# Patient Record
Sex: Female | Born: 1980 | Race: White | Hispanic: No | Marital: Single | Smoking: Never smoker
Health system: Southern US, Community
[De-identification: ages and names within clinical notes are randomized; demographics above are authoritative.]

## PROBLEM LIST (undated history)

## (undated) DIAGNOSIS — R7303 Prediabetes: Secondary | ICD-10-CM

## (undated) DIAGNOSIS — N809 Endometriosis, unspecified: Secondary | ICD-10-CM

## (undated) DIAGNOSIS — R112 Nausea with vomiting, unspecified: Secondary | ICD-10-CM

## (undated) DIAGNOSIS — J45909 Unspecified asthma, uncomplicated: Secondary | ICD-10-CM

## (undated) DIAGNOSIS — U071 COVID-19: Secondary | ICD-10-CM

## (undated) DIAGNOSIS — J189 Pneumonia, unspecified organism: Secondary | ICD-10-CM

## (undated) DIAGNOSIS — Z9889 Other specified postprocedural states: Secondary | ICD-10-CM

## (undated) DIAGNOSIS — R519 Headache, unspecified: Secondary | ICD-10-CM

## (undated) DIAGNOSIS — R51 Headache: Secondary | ICD-10-CM

## (undated) DIAGNOSIS — N200 Calculus of kidney: Secondary | ICD-10-CM

## (undated) DIAGNOSIS — I10 Essential (primary) hypertension: Secondary | ICD-10-CM

## (undated) HISTORY — PX: LAPAROSCOPY: SHX197

## (undated) HISTORY — PX: CHOLECYSTECTOMY: SHX55

## (undated) HISTORY — DX: COVID-19: U07.1

## (undated) HISTORY — DX: Calculus of kidney: N20.0

## (undated) HISTORY — PX: OTHER SURGICAL HISTORY: SHX169

## (undated) HISTORY — DX: Unspecified asthma, uncomplicated: J45.909

## (undated) HISTORY — PX: KIDNEY STONE SURGERY: SHX686

---

## 1999-09-30 ENCOUNTER — Emergency Department (HOSPITAL_COMMUNITY): Admission: EM | Admit: 1999-09-30 | Discharge: 1999-10-01 | Payer: Self-pay | Admitting: Internal Medicine

## 1999-10-12 ENCOUNTER — Emergency Department (HOSPITAL_COMMUNITY): Admission: EM | Admit: 1999-10-12 | Discharge: 1999-10-12 | Payer: Self-pay

## 1999-10-13 ENCOUNTER — Encounter: Payer: Self-pay | Admitting: Urology

## 1999-10-13 ENCOUNTER — Ambulatory Visit (HOSPITAL_COMMUNITY): Admission: RE | Admit: 1999-10-13 | Discharge: 1999-10-13 | Payer: Self-pay | Admitting: Urology

## 1999-10-15 ENCOUNTER — Emergency Department (HOSPITAL_COMMUNITY): Admission: EM | Admit: 1999-10-15 | Discharge: 1999-10-15 | Payer: Self-pay | Admitting: Emergency Medicine

## 1999-10-15 ENCOUNTER — Encounter: Payer: Self-pay | Admitting: Emergency Medicine

## 1999-10-16 ENCOUNTER — Emergency Department (HOSPITAL_COMMUNITY): Admission: EM | Admit: 1999-10-16 | Discharge: 1999-10-16 | Payer: Self-pay | Admitting: Emergency Medicine

## 1999-10-16 ENCOUNTER — Encounter: Payer: Self-pay | Admitting: Emergency Medicine

## 1999-10-18 ENCOUNTER — Encounter: Payer: Self-pay | Admitting: Emergency Medicine

## 1999-10-18 ENCOUNTER — Inpatient Hospital Stay (HOSPITAL_COMMUNITY): Admission: EM | Admit: 1999-10-18 | Discharge: 1999-10-21 | Payer: Self-pay | Admitting: Emergency Medicine

## 2000-01-11 ENCOUNTER — Ambulatory Visit (HOSPITAL_COMMUNITY): Admission: RE | Admit: 2000-01-11 | Discharge: 2000-01-11 | Payer: Self-pay | Admitting: Obstetrics and Gynecology

## 2000-01-11 ENCOUNTER — Encounter (INDEPENDENT_AMBULATORY_CARE_PROVIDER_SITE_OTHER): Payer: Self-pay

## 2001-01-29 ENCOUNTER — Emergency Department (HOSPITAL_COMMUNITY): Admission: EM | Admit: 2001-01-29 | Discharge: 2001-01-29 | Payer: Self-pay | Admitting: Emergency Medicine

## 2001-08-02 ENCOUNTER — Ambulatory Visit (HOSPITAL_COMMUNITY): Admission: RE | Admit: 2001-08-02 | Discharge: 2001-08-02 | Payer: Self-pay | Admitting: Obstetrics and Gynecology

## 2001-08-02 ENCOUNTER — Encounter: Payer: Self-pay | Admitting: Obstetrics and Gynecology

## 2001-09-10 ENCOUNTER — Other Ambulatory Visit: Admission: RE | Admit: 2001-09-10 | Discharge: 2001-09-10 | Payer: Self-pay | Admitting: Obstetrics and Gynecology

## 2002-01-03 ENCOUNTER — Inpatient Hospital Stay (HOSPITAL_COMMUNITY): Admission: AD | Admit: 2002-01-03 | Discharge: 2002-01-03 | Payer: Self-pay | Admitting: Obstetrics and Gynecology

## 2002-01-19 ENCOUNTER — Encounter: Payer: Self-pay | Admitting: Obstetrics and Gynecology

## 2002-01-19 ENCOUNTER — Inpatient Hospital Stay (HOSPITAL_COMMUNITY): Admission: AD | Admit: 2002-01-19 | Discharge: 2002-01-19 | Payer: Self-pay | Admitting: Obstetrics and Gynecology

## 2002-01-21 ENCOUNTER — Inpatient Hospital Stay (HOSPITAL_COMMUNITY): Admission: AD | Admit: 2002-01-21 | Discharge: 2002-01-21 | Payer: Self-pay | Admitting: Obstetrics and Gynecology

## 2002-01-21 ENCOUNTER — Encounter: Payer: Self-pay | Admitting: Obstetrics and Gynecology

## 2002-01-22 ENCOUNTER — Inpatient Hospital Stay (HOSPITAL_COMMUNITY): Admission: AD | Admit: 2002-01-22 | Discharge: 2002-01-22 | Payer: Self-pay | Admitting: Obstetrics and Gynecology

## 2002-01-25 ENCOUNTER — Inpatient Hospital Stay (HOSPITAL_COMMUNITY): Admission: AD | Admit: 2002-01-25 | Discharge: 2002-01-25 | Payer: Self-pay | Admitting: Obstetrics and Gynecology

## 2002-01-26 ENCOUNTER — Inpatient Hospital Stay (HOSPITAL_COMMUNITY): Admission: AD | Admit: 2002-01-26 | Discharge: 2002-01-26 | Payer: Self-pay | Admitting: *Deleted

## 2002-01-28 DIAGNOSIS — I10 Essential (primary) hypertension: Secondary | ICD-10-CM

## 2002-01-28 HISTORY — DX: Essential (primary) hypertension: I10

## 2002-01-31 ENCOUNTER — Encounter (INDEPENDENT_AMBULATORY_CARE_PROVIDER_SITE_OTHER): Payer: Self-pay | Admitting: Specialist

## 2002-01-31 ENCOUNTER — Inpatient Hospital Stay (HOSPITAL_COMMUNITY): Admission: AD | Admit: 2002-01-31 | Discharge: 2002-02-07 | Payer: Self-pay | Admitting: Obstetrics and Gynecology

## 2002-01-31 ENCOUNTER — Encounter: Payer: Self-pay | Admitting: Obstetrics and Gynecology

## 2002-02-01 ENCOUNTER — Encounter: Payer: Self-pay | Admitting: Obstetrics and Gynecology

## 2002-02-03 ENCOUNTER — Encounter: Payer: Self-pay | Admitting: Obstetrics and Gynecology

## 2002-02-05 ENCOUNTER — Encounter: Payer: Self-pay | Admitting: *Deleted

## 2002-02-07 ENCOUNTER — Encounter (HOSPITAL_COMMUNITY): Admission: RE | Admit: 2002-02-07 | Discharge: 2002-03-09 | Payer: Self-pay | Admitting: Obstetrics and Gynecology

## 2002-03-20 ENCOUNTER — Other Ambulatory Visit: Admission: RE | Admit: 2002-03-20 | Discharge: 2002-03-20 | Payer: Self-pay | Admitting: Obstetrics and Gynecology

## 2003-08-18 ENCOUNTER — Other Ambulatory Visit: Admission: RE | Admit: 2003-08-18 | Discharge: 2003-08-18 | Payer: Self-pay | Admitting: Obstetrics and Gynecology

## 2004-08-20 ENCOUNTER — Emergency Department (HOSPITAL_COMMUNITY): Admission: EM | Admit: 2004-08-20 | Discharge: 2004-08-20 | Payer: Self-pay | Admitting: Emergency Medicine

## 2004-09-14 ENCOUNTER — Other Ambulatory Visit: Admission: RE | Admit: 2004-09-14 | Discharge: 2004-09-14 | Payer: Self-pay | Admitting: Obstetrics and Gynecology

## 2004-09-16 ENCOUNTER — Emergency Department (HOSPITAL_COMMUNITY): Admission: EM | Admit: 2004-09-16 | Discharge: 2004-09-17 | Payer: Self-pay | Admitting: Emergency Medicine

## 2004-11-24 ENCOUNTER — Inpatient Hospital Stay (HOSPITAL_COMMUNITY): Admission: AD | Admit: 2004-11-24 | Discharge: 2004-11-24 | Payer: Self-pay | Admitting: Obstetrics and Gynecology

## 2005-01-08 ENCOUNTER — Inpatient Hospital Stay (HOSPITAL_COMMUNITY): Admission: AD | Admit: 2005-01-08 | Discharge: 2005-01-08 | Payer: Self-pay | Admitting: Obstetrics and Gynecology

## 2005-01-10 ENCOUNTER — Inpatient Hospital Stay (HOSPITAL_COMMUNITY): Admission: AD | Admit: 2005-01-10 | Discharge: 2005-01-10 | Payer: Self-pay | Admitting: Obstetrics and Gynecology

## 2005-01-27 ENCOUNTER — Inpatient Hospital Stay (HOSPITAL_COMMUNITY): Admission: AD | Admit: 2005-01-27 | Discharge: 2005-01-28 | Payer: Self-pay | Admitting: Obstetrics and Gynecology

## 2005-03-10 ENCOUNTER — Inpatient Hospital Stay (HOSPITAL_COMMUNITY): Admission: AD | Admit: 2005-03-10 | Discharge: 2005-03-11 | Payer: Self-pay | Admitting: Obstetrics and Gynecology

## 2005-03-11 ENCOUNTER — Encounter (INDEPENDENT_AMBULATORY_CARE_PROVIDER_SITE_OTHER): Payer: Self-pay | Admitting: Specialist

## 2005-03-11 ENCOUNTER — Inpatient Hospital Stay (HOSPITAL_COMMUNITY): Admission: RE | Admit: 2005-03-11 | Discharge: 2005-03-14 | Payer: Self-pay | Admitting: Obstetrics and Gynecology

## 2005-05-04 ENCOUNTER — Other Ambulatory Visit: Admission: RE | Admit: 2005-05-04 | Discharge: 2005-05-04 | Payer: Self-pay | Admitting: Obstetrics and Gynecology

## 2006-04-24 ENCOUNTER — Emergency Department (HOSPITAL_COMMUNITY): Admission: EM | Admit: 2006-04-24 | Discharge: 2006-04-24 | Payer: Self-pay | Admitting: Emergency Medicine

## 2006-07-03 ENCOUNTER — Emergency Department (HOSPITAL_COMMUNITY): Admission: EM | Admit: 2006-07-03 | Discharge: 2006-07-03 | Payer: Self-pay | Admitting: Family Medicine

## 2007-04-29 ENCOUNTER — Emergency Department (HOSPITAL_COMMUNITY): Admission: EM | Admit: 2007-04-29 | Discharge: 2007-04-29 | Payer: Self-pay | Admitting: Emergency Medicine

## 2007-05-01 ENCOUNTER — Ambulatory Visit: Payer: Self-pay | Admitting: Family Medicine

## 2007-05-23 ENCOUNTER — Ambulatory Visit: Payer: Self-pay | Admitting: Family Medicine

## 2007-06-18 ENCOUNTER — Ambulatory Visit: Payer: Self-pay | Admitting: Family Medicine

## 2007-08-17 ENCOUNTER — Ambulatory Visit: Payer: Self-pay | Admitting: Family Medicine

## 2007-08-27 ENCOUNTER — Ambulatory Visit: Payer: Self-pay | Admitting: Family Medicine

## 2007-08-27 ENCOUNTER — Encounter: Admission: RE | Admit: 2007-08-27 | Discharge: 2007-08-27 | Payer: Self-pay | Admitting: Family Medicine

## 2007-08-28 ENCOUNTER — Ambulatory Visit: Payer: Self-pay | Admitting: Family Medicine

## 2007-08-28 ENCOUNTER — Encounter: Admission: RE | Admit: 2007-08-28 | Discharge: 2007-08-28 | Payer: Self-pay | Admitting: Family Medicine

## 2007-09-03 ENCOUNTER — Ambulatory Visit: Payer: Self-pay | Admitting: Family Medicine

## 2008-02-26 ENCOUNTER — Ambulatory Visit: Payer: Self-pay | Admitting: Family Medicine

## 2008-02-26 ENCOUNTER — Encounter: Admission: RE | Admit: 2008-02-26 | Discharge: 2008-02-26 | Payer: Self-pay | Admitting: Family Medicine

## 2008-09-17 ENCOUNTER — Ambulatory Visit: Payer: Self-pay | Admitting: Family Medicine

## 2008-09-17 ENCOUNTER — Encounter: Admission: RE | Admit: 2008-09-17 | Discharge: 2008-09-17 | Payer: Self-pay | Admitting: Family Medicine

## 2008-09-18 ENCOUNTER — Encounter: Admission: RE | Admit: 2008-09-18 | Discharge: 2008-09-18 | Payer: Self-pay | Admitting: Family Medicine

## 2008-10-08 ENCOUNTER — Ambulatory Visit: Payer: Self-pay | Admitting: Family Medicine

## 2008-10-29 ENCOUNTER — Ambulatory Visit: Payer: Self-pay | Admitting: Family Medicine

## 2008-11-26 IMAGING — CR DG CHEST 2V
2 series · 2 of 2 positions shown · non-contrast
Comparison: 08/28/2007

CLINICAL DATA: Shortness of breath and cough

CHEST - 2 VIEW

[w chest pa]
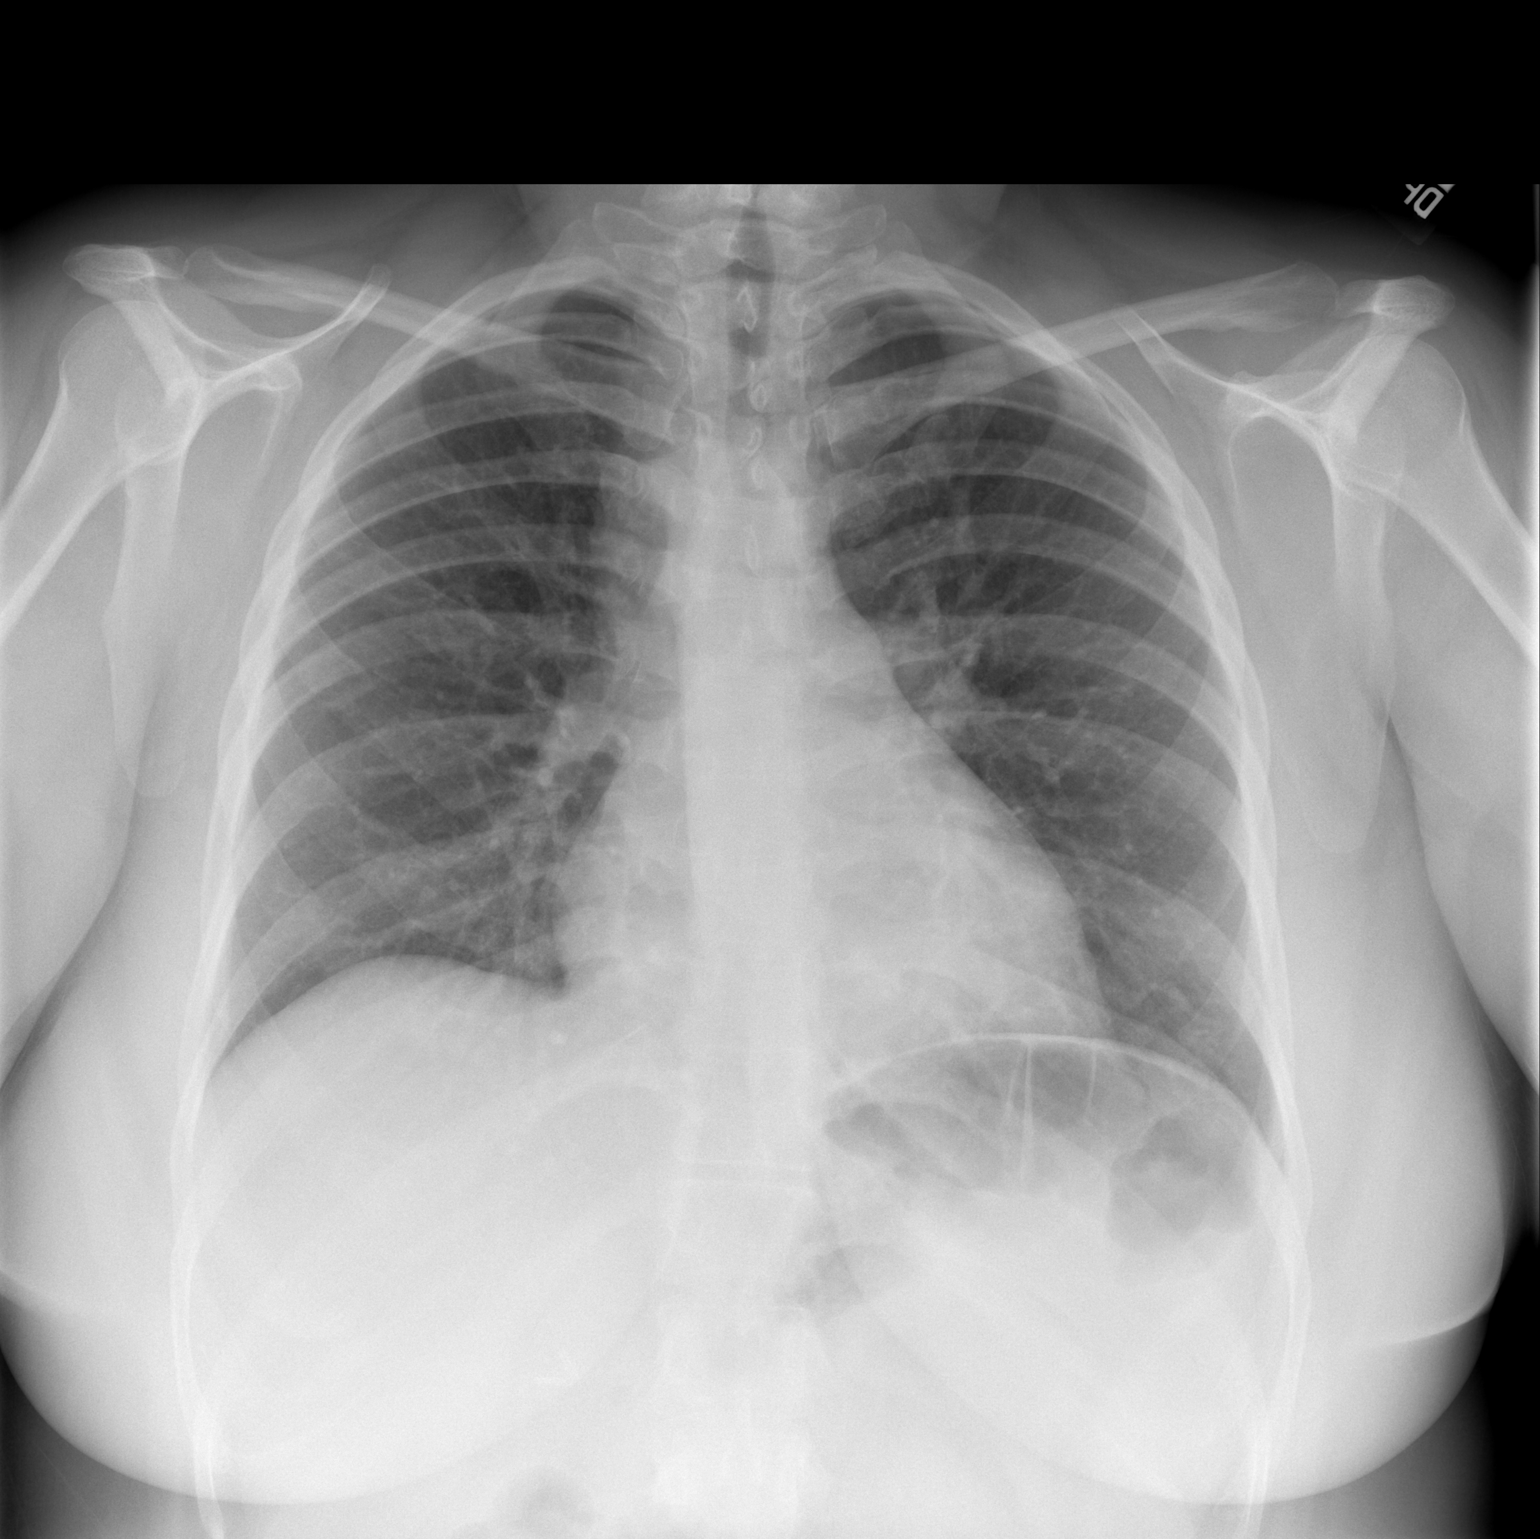

[w chest lat]
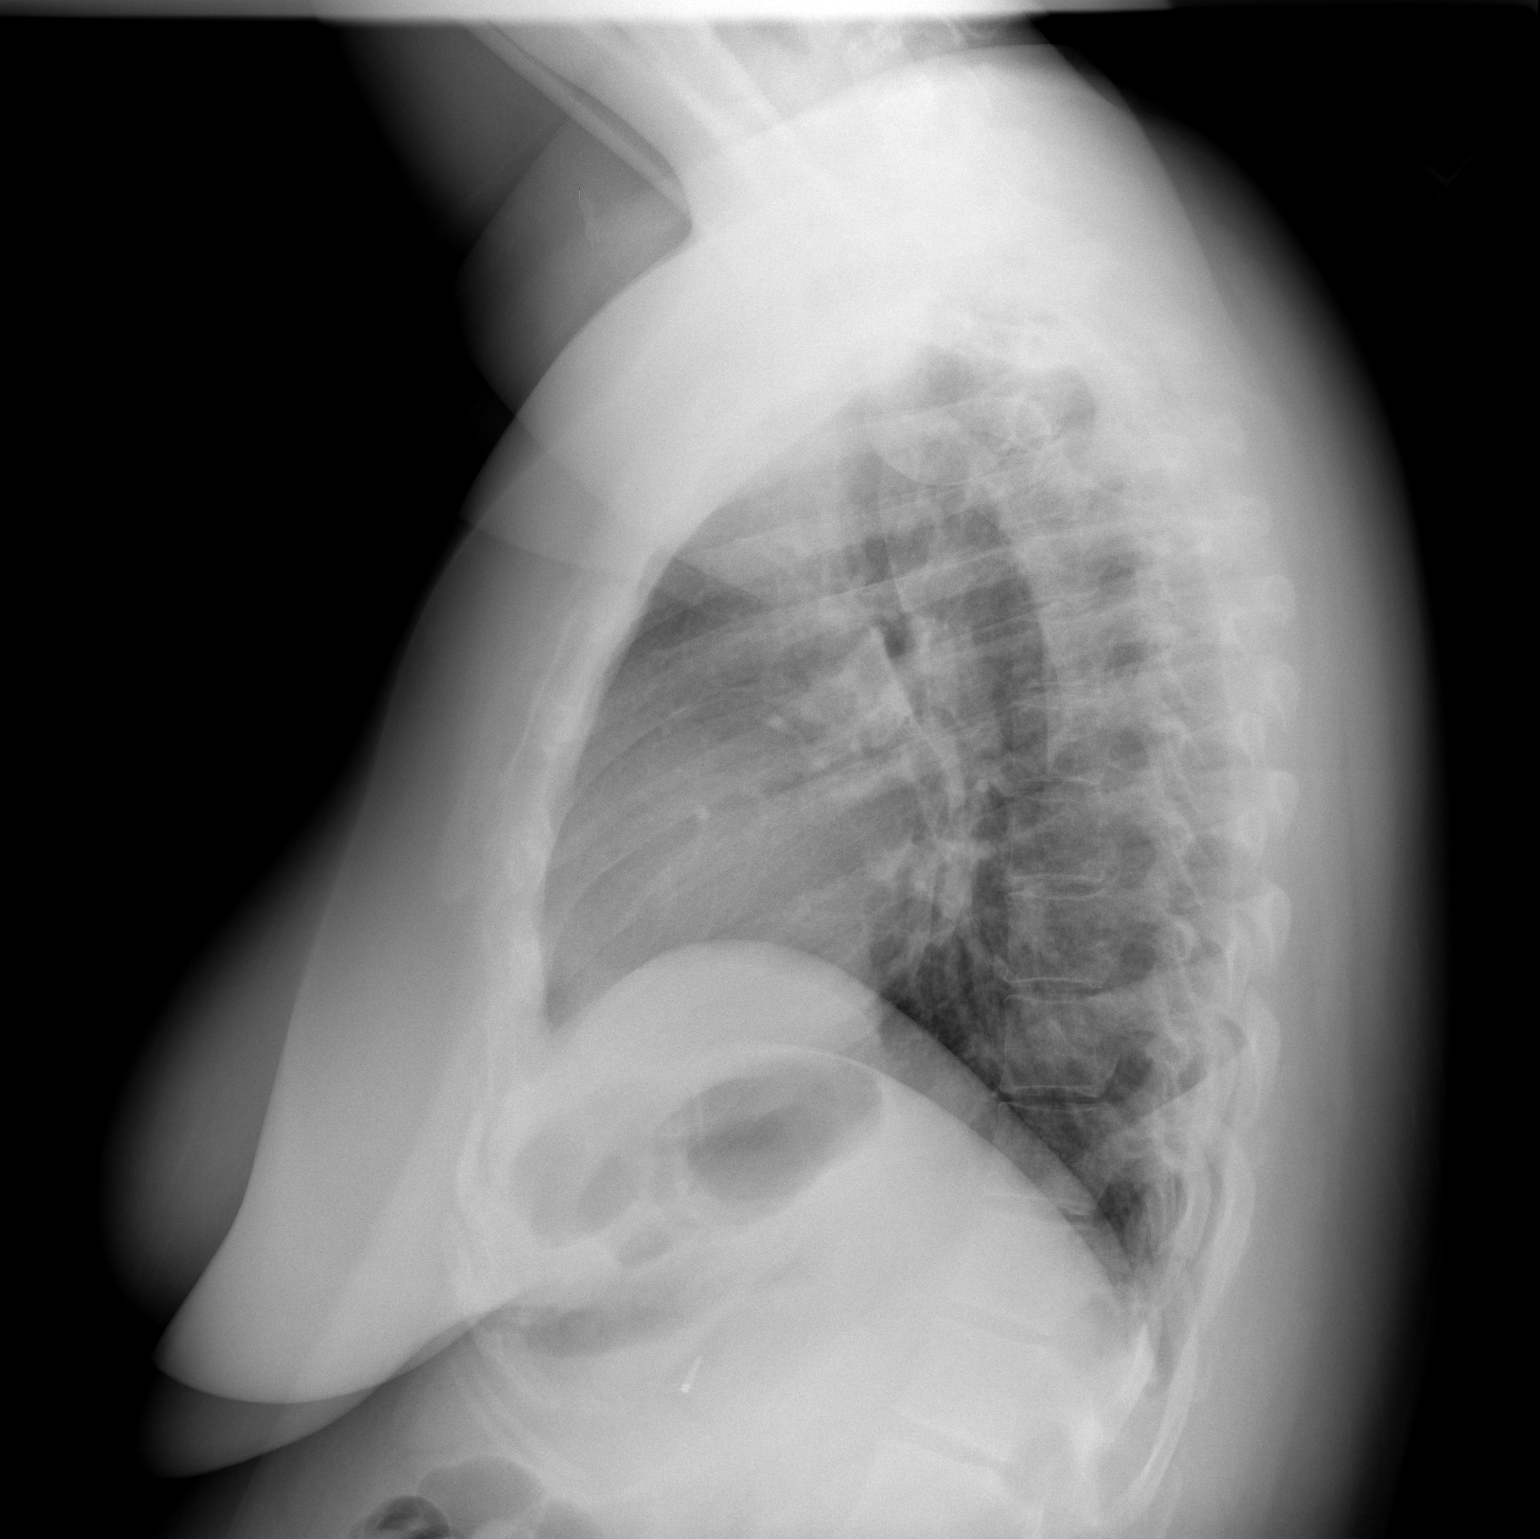

[2 of 2 positions shown; findings below may reference images not displayed]

FINDINGS: The heart size and mediastinal contours are within normal
limits.  Both lungs are clear.  The visualized skeletal structures
are unremarkable.
IMPRESSION: No active cardiopulmonary abnormalities.

## 2009-03-13 ENCOUNTER — Emergency Department (HOSPITAL_COMMUNITY): Admission: EM | Admit: 2009-03-13 | Discharge: 2009-03-13 | Payer: Self-pay | Admitting: Emergency Medicine

## 2009-09-21 ENCOUNTER — Ambulatory Visit: Payer: Self-pay | Admitting: Family Medicine

## 2010-06-20 ENCOUNTER — Encounter: Payer: Self-pay | Admitting: Family Medicine

## 2010-09-02 LAB — COMPREHENSIVE METABOLIC PANEL
ALT: 29 U/L (ref 0–35)
AST: 41 U/L — ABNORMAL HIGH (ref 0–37)
CO2: 21 mEq/L (ref 19–32)
Calcium: 8.5 mg/dL (ref 8.4–10.5)
Chloride: 108 mEq/L (ref 96–112)
GFR calc non Af Amer: >60 mL/min (ref >60)
Sodium: 137 mEq/L (ref 135–145)
Total Bilirubin: 1.2 mg/dL (ref 0.3–1.2)
Total Protein: 7.6 g/dL (ref 6.0–8.3)

## 2010-09-02 LAB — CBC
HCT: 40.5 % (ref 36.0–46.0)
MCHC: 32.4 g/dL (ref 30.0–36.0)
RDW: 16.2 % — ABNORMAL HIGH (ref 11.5–15.5)

## 2010-09-02 LAB — URINALYSIS, ROUTINE W REFLEX MICROSCOPIC
Nitrite: NEGATIVE
Specific Gravity, Urine: 1.02 (ref 1.005–1.030)
Urobilinogen, UA: 1 mg/dL (ref 0.0–1.0)
pH: 7.5 (ref 5.0–8.0)

## 2010-09-02 LAB — URINE MICROSCOPIC-ADD ON

## 2010-09-02 LAB — DIFFERENTIAL
Basophils Absolute: 0 10*3/uL (ref 0.0–0.1)
Basophils Relative: 0 % (ref 0–1)
Eosinophils Absolute: 0 10*3/uL (ref 0.0–0.7)
Eosinophils Relative: 0 % (ref 0–5)
Lymphocytes Relative: 8 % — ABNORMAL LOW (ref 12–46)
Lymphs Abs: 0.9 10*3/uL (ref 0.7–4.0)
Monocytes Relative: 6 % (ref 3–12)
Neutro Abs: 9.8 10*3/uL — ABNORMAL HIGH (ref 1.7–7.7)

## 2010-09-02 LAB — POCT PREGNANCY, URINE: Preg Test, Ur: NEGATIVE

## 2010-09-02 LAB — LIPASE, BLOOD: Lipase: 23 U/L (ref 11–59)

## 2010-10-15 NOTE — Op Note (Signed)
[name], [name]             ACCOUNT NO.:  [id number]   MEDICAL RECORD NO.:  [id number]          PATIENT TYPE:  INP   LOCATION:  9199                          FACILITY:  [facility]   PHYSICIAN:  Guy Sandifer. Henderson Cloud, M.D. DATE OF BIRTH:  August 24, 1980   DATE OF PROCEDURE:  03/11/2005  DATE OF DISCHARGE:                                 OPERATIVE REPORT   PREOPERATIVE DIAGNOSES:  1.  Pregnancy at 37-1/7 weeks' estimated sexual age.  2.  Previous cesarean section, desires repeat.  3.  Probable rupture of membranes, in labor.  4.  Bicornate uterus.   POSTOPERATIVE DIAGNOSES:  1.  Pregnancy at 37-1/7 weeks' estimated sexual age.  2.  Previous cesarean section, desires repeat.  3.  Probable rupture of membranes, in labor.  4.  Bicornate uterus.   PROCEDURE:  Low-transverse cesarean section.   SURGEON:  Guy Sandifer. Henderson Cloud, M.D.   ANESTHESIA:  Spinal, Raul Del, M.D.   ESTIMATED BLOOD LOSS:  800 mL.   SPECIMENS:  Placenta to pathology.   FINDINGS:  Viable female infant, Apgars of 9 and 9 at one and five minutes,  respectively.  Birth weight and arterial cord pH pending.   INDICATIONS AND CONSENT:  This patient is a 30 year old married white  female, G2, P27, with an EDC of March 31, 2005, who has a bicornate uterus  and a previous cesarean section.  She desires repeat.  She has had  intermittent leaking of fluid, possibly since yesterday.  No fever, central  nervous changes or epigastric pain.  She does have increasing contractions  getting progressively harder.  No bleeding.  Examination in the office  reveals a reactive nonstress test.  She is contracting every two to four  minutes out.  Cervix is 2 cm dilated was 1 cm three days ago.  Sterile  speculum exam did not reveal a pool and Nitrazine was negative.  A diagnosis  of labor was made and possible rupture of membranes.  Recommendation for  delivery was made.  Repeat cesarean section was discussed.  Potential risks and  complications  were reviewed preoperatively including but limited to infection, bowel,  bladder or ureteral damage, bleeding requiring transfusion of blood products  with possible transfusion reaction, HIV and hepatitis acquisition, DVT, PE  and pneumonia.  All questions were answered and consent was signed on the  chart.   PROCEDURE:  The patient is taken to the operating room, where she is  identified, spinal anesthetic is placed and she is placed in the dorsal  supine position with a 15 degree left lateral wedge.  She is prepped and a  Foley catheter is placed and the bladder is drained and she is draped in a  sterile fashion.  After testing for adequate spinal anesthesia, skin is  entered through the previous Pfannenstiel scar and dissection is carried out  layers to the peritoneum.  Peritoneum is incised and extended superiorly and  inferiorly.  The vesicouterine peritoneum is taken down cephalolaterally.  The bladder flap was developed.  The bladder blade is placed.  Uterus is  incised in a low transverse manner  and the uterine cavity is entered bluntly  with a hemostat.  The uterine incision is extended cephalolaterally with the  fingers.  The lower uterine segment was very thin.  Artificial rupture of  membranes for clear fluid is carried out.  Vertex is delivered and the  oropharynx and nasopharynx were suctioned.  The remainder of baby is  delivered and a good cry and tone is noted.  The cord is clamped and cut and  the baby is handed to the awaiting pediatrics team.  The placenta is  manually delivered and sent to pathology.  The uterine cavity is clean.  The uterus was closed in two running locking imbricating layers of 0  Monocryl suture, which achieves good hemostasis.  The uterus is bicornate  with the pregnancy on the left side.  Tubes and ovaries are normal  bilaterally.  Anterior peritoneum was closed running fashion with 0 Monocryl  suture, which is also used to  reapproximate the pyramidalis muscle in the  midline.  Anterior rectus fascia is closed in running fashion with 0 PDS  suture and the skin is closed with clips.  All sponge, instrument and needle  counts were correct and the patient is transferred to the recovery room in  stable condition.      Guy Sandifer Henderson Cloud, M.D.  Electronically Signed     JET/MEDQ  D:  03/11/2005  T:  03/11/2005  Job:  [id number]

## 2010-10-15 NOTE — Discharge Summary (Signed)
[name], [name]             ACCOUNT NO.:  [id number]   MEDICAL RECORD NO.:  [id number]          PATIENT TYPE:  INP   LOCATION:  9123                          FACILITY:  [facility]   PHYSICIAN:  Juluis Mire, M.D.   DATE OF BIRTH:  1980-09-06   DATE OF ADMISSION:  03/11/2005  DATE OF DISCHARGE:  03/14/2005                                 DISCHARGE SUMMARY   ADMISSION DIAGNOSES:  1.  Intrauterine pregnancy at 37-1/7 weeks estimated gestational age.  2.  Previous cesarean section, desires repeat.  3.  Spontaneous onset of labor with questionable rupture of membranes.   DISCHARGE DIAGNOSES:  1.  Status post low transverse cesarean section.  2.  Viable female infant.   PROCEDURE:  Repeat low transverse cesarean section.   REASON FOR ADMISSION:  Please see written H&P.   HOSPITAL COURSE:  The patient is a 30 year old white married female, gravida  2, para 1, that was admitted to [facility] with spontaneous  onset of labor.  The patient had a previous cesarean delivery and desired  repeat.  The patient also had reported some questionable intermittent  leaking of amniotic fluid.  The patient was then transferred to the  operating room where spinal anesthesia was administered without difficulty.  A low transverse incision was made with delivery of a viable female infant  weighing 6 pounds 5 ounces with Apgars of 9 at one minute and 9 at five  minutes.  Arterial cord pH was 7.32.  The patient tolerated the procedure  well and was taken to the recovery room in stable condition.  On  postoperative day #1, the patient was without complaint.  Vital signs were  stable.  She was afebrile.  Abdomen was soft with good return of bowel  function.  Abdominal dressing was noted to be clean, dry, and intact.  Laboratory findings revealed hemoglobin of 9.7, platelet count of 140,000,  WBC count of 12.5.  On postoperative day #2, the patient was without  complaint.  Vital signs were  stable.  She was afebrile.  Abdomen was soft,  fundus was firm and nontender. Incision was clean, dry, and intact.  On  postoperative day #3, the patient was without complaint.  Vital signs were  stable.  Fundus was firm and nontender.  Incision was noted to have some  slight moisture noted at the left margin of the incision.  Staples were  therefore left in.  Instructions were reviewed and the patient was later  discharged home.   CONDITION ON DISCHARGE:  Stable.   DIET:  Regular as tolerated.   ACTIVITY:  No heavy lifting, no driving x2 weeks, and no vaginal entry.   FOLLOW UP:  The patient is to follow up in 2 days for staple removal.  She  is to call for a temperature greater than 100 degrees, persistent nausea and  vomiting, heavy vaginal bleeding, and/or drainage from the incision site.   DISCHARGE MEDICATIONS:  1.  Tylox #30 one p.o. every 4-6 hours p.r.n.  2.  Prenatal vitamins one p.o. daily.  3.  Colace one p.o. daily p.r.n.  Julio Sicks, N.P.      Juluis Mire, M.D.  Electronically Signed    CC/MEDQ  D:  04/10/2005  T:  04/10/2005  Job:  [id number]

## 2010-10-15 NOTE — H&P (Signed)
[facility]  Patient:    [name], [name]                    MRN: [id number] Adm. Date:  [id number] Attending:  Verneita Griffes CC:         Anselmo Rod, M.D.             Maretta Bees. Vonita Moss, M.D.                         History and Physical  CHIEF COMPLAINT:  Abdominal pain.  HISTORY:  This very complex 30 year old woman began to have abdominal pain roughly four weeks ago.  At that time, the pain was in the left upper quadrant, radiating around to the left flank, but there was some lower abdominal component.  As a matter of fact, when she was seen in our office, this was thought to be a lower abdominal problem.  She underwent pelvic exam and appropriate cultures as well as an hCG, all of which were negative.  The pain continued exacerbated.  She was seen in the emergency room and ultimately, a week ago, a diagnosis of left renal calculus was made.  It was a 1- to 2-mm stone that was thought to be pass fine on its own; however, because of persistent pain, it was extracted by Dr. Maretta Bees. Peterson via [employer] about five days ago.  There has been no resolution of the pain.  The pain is more diffuse, though certainly it flares at some times and not at other times.  It seems to be brought on and aggravated by eating.  She has been back to the emergency room four times since that extraction five days ago and it was decided that an admission with more extensive workup was appropriate and needed.  PAST MEDICAL HISTORY:  Significant in that at age 56, she had right lower quadrant pain that was thought to be appendicitis; however, a diagnosis of PID was made and she was on extensive IV antibiotics, first in the hospital, then as an outpatient.  Three years ago, at age 23, she underwent laparoscopic cholecystectomy.  She has had no other hospitalizations, illnesses or injuries.  REVIEW OF SYSTEMS:  The patient is sleeping now but according to parents, it is  unremarkable.  SOCIAL AND FAMILY HISTORY:  She is the oldest of four children.  She has a younger brother and two younger sisters.  She lives at home with her family. 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  Does not smoke or use alcohol.  PHYSICAL EXAMINATION:  GENERAL:  Patient is sleeping now but apparently she was alert on admission and in severe pain, with hyperventilation and carpopedal spasm.  VITAL SIGNS:  Blood pressure 110/74, pulse 92, respiratory rate 20, temperature 98 degrees.  HEENT:  Normocephalic.  EACs and TMs clear.  PERRLA.  EOMs intact.  Fundi benign.  Nares unobstructed.  No septal deviation.  Tongue not coated.  Uvula is in the midline.  NECK:  Supple.  No nodes, masses, thyroid enlargement or tracheal deviation.  CHEST:  Chest expands symmetrically.  No wheezes, rales or rhonchi.  HEART:  Normal size clinically.  No murmurs, rubs, or gallops.  BREASTS:  Deferred.  ABDOMEN:  Obese, soft.  There are no masses, guarding or rigidity but she seems to have diffuse tenderness with palpation.  Good peripheral pulses are intact without edema.  No skin  lesions are noted.  GYNECOLOGIC EXAM:  Deferred because of the recent one in our office.  CENTRAL NERVOUS SYSTEM:  Cranial nerves II-XII intact.  No gross motor or sensory deficits.  Cerebellar function is intact.  Deep tendon reflexes are present, equal and brisk.  IMPRESSION: 1. Abdominal pain of uncertain cause. 2. Recent left renal calculus, removed. 3. Exogenous obesity. 4. Status post laparoscopic cholecystectomy. 5. Status post severe pelvic inflammatory disease. DD:  10/18/99 TD:  10/18/99 Job: [id number]

## 2010-10-15 NOTE — Discharge Summary (Signed)
NAME:  [name], [name]                       ACCOUNT NO.:  [id number]   MEDICAL RECORD NO.:  [id number]                   PATIENT TYPE:  INP   LOCATION:  9101                                 FACILITY:  [facility]   PHYSICIAN:  Dineen Kid. Rana Snare, M.D.                 DATE OF BIRTH:  Aug 03, 1980   DATE OF ADMISSION:  01/31/2002  DATE OF DISCHARGE:  02/07/2002                                 DISCHARGE SUMMARY   ADMITTING DIAGNOSES:  1. Intrauterine pregnancy at 27 weeks estimated gestational age.  2. Preterm premature rupture of membranes.  3. Chorioamnionitis.  4. Fetal decelerations.  5. Bicornuate uterus.   DISCHARGE DIAGNOSES:  1. Status post low transverse cesarean delivery.  2. Viable female infant.  3. Preeclampsia, resolving.  4. Endometritis, resolving.   PROCEDURE:  Primary low transverse cesarean section.   REASON FOR ADMISSION:  Please see dictated H&P.   HOSPITAL COURSE:  The patient was admitted to [facility] at  31 weeks estimated gestational age with questionable preterm premature  rupture of membranes x3 days.  Pregnancy had been complicated by a previous  sonogram persistent with borderline low amniotic fluid volume.  Uterus was  nontender to examination.  Cervix was closed.  Speculum examination revealed  slight pooling of amniotic fluid.  Fetal heart tones were reactive without  deceleration.  Ultrasound was obtained revealing an amniotic fluid index of  12.1 cm and vertex presentation.  Cervical length was noted at 3.0 cm.  Laboratories revealed WBC count of 12.2, hemoglobin of 12.1.  The patient  was admitted to the hospital.  IV antibiotics were administered.  Betamethasone was given to enhance fetal lung maturity.  A continuous fetal  monitoring was performed.  Later that same day three episodes of  decelerations were noted with a good recovery.  Baseline fetal heart tones  were unchanged.  Fetus was reactive.  Deep tendon reflexes were noted to  be  slightly brisk and a PIH panel was obtained.  Liver function tests were  within normal limits.  Platelet count was 185,000.  On hospital day two no  contractions were noted.  Fetal heart tones were reactive.  Uterus continued  to be soft and nontender.  IV antibiotics were continued.  On hospital day  three patient began to feel sick with some nausea associated with a  headache.  The patient was afebrile.  Uterus was slightly tender to  examination.  Fetal heart tones were reassuring and reactive.  Laboratories  revealed an elevation in her WBC count up to 24.2.  It was determined that  patient was developing chorioamnionitis.  Options were discussed with  patient and decision was made to proceed with a primary low transverse  cesarean delivery.  The patient was taken to the operating room where spinal  anesthesia was administered without difficulty.  A low transverse incision  was made with the delivery of a viable  female infant weighing 3 pounds 10  ounces with Apgars of 6 at one minute, 8 at five minutes, and 10 at ten  minutes.  Pediatric team was present.  Amniotic fluid was noted to be murky.  Bicornuate uterus was noted.  The patient tolerated procedure well and was  taken to the recovery room in stable condition.  The patient later became  hyperreflexic 4+ with three beats of clonus.  Blood pressures were within  normal limits but urine output was borderline.  Lasix was administered.  The  patient was administered magnesium sulfate x24 hours and was transferred to  the adult intensive care unit.  IV antibiotics were continued.  On  postoperative day one patient complained of epigastric pain.  She was  afebrile.  Abdomen was soft and uterus continued to be moderately tender.  Abdominal dressing was clean, dry, and intact.  Oxygen saturation was 95% on  room air.  Deep tendon reflexes remained at 3+ with three beats of clonus.  Laboratories revealed liver function test continued to  be within normal  limits.  Hemoglobin was 9.9, WBC count was 16.8 and platelets were 132,000.  Magnesium sulfate drip was discontinued after 24 hours.  On postoperative  day two vital signs were stable.  Blood pressure was 110/60.  The patient  was afebrile.  Urine output was adequate.  Orders were written for patient  to be transferred to the mother/baby unit.  IV antibiotics were continued.  Upon patient out of bed to the bathroom, patient complained of shortness of  breath with movement.  She complained also of a nonproductive cough.  Chest  x-ray was obtained which revealed a persistent pulmonary vascular congestion  and pleural effusion.  Lasix was administered.  On the following day  postoperative day three patient was transferred to the mother/baby unit.  She was afebrile x48 hours.  Vital signs were stable.  Oxygen saturation was  96% on room air.  Fundus was firm and nontender.  Laboratories revealed a  WBC count of 13.0.  Liver function tests were within normal limits.  On  postoperative day four patient complained of a mild frontal headache with  blurred vision.  She denied right upper quadrant pain.  Vital signs were  stable.  Blood pressure was 151/96.  Abdomen remained soft and nontender.  Incision was clean, dry, and intact.  Procardia 30 XL was started.  On  postoperative day four headache had improved.  Blood pressure was 110/60-  115/80.  Uterus was nontender.  Incision was clean, dry, and intact.  Staples were removed and patient was discharged home.   CONDITION ON DISCHARGE:  Good.   DIET:  Regular, as tolerated.   ACTIVITY:  No heavy lifting.  No driving x2 weeks.  No vaginal entry.   FOLLOW UP:  The patient is to follow up in the office in one week for an  incision check.  She is to call for temperature greater than 100 degrees,  persistent nausea/vomiting, heavy vaginal bleeding, or redness or drainage  from the incision site.  DISCHARGE MEDICATIONS:  1.  Percocet 5/325 number 30 one p.o. q.4-6h. p.r.n. pain.  2. Precare prenatal vitamins one p.o. daily.  3. Procardia XL 30 mg one p.o. daily.     Julio Sicks, NP                          Dineen Kid. Rana Snare, M.D.    CC/MEDQ  D:  03/07/2002  T:  03/08/2002  Job:  [id number]

## 2010-10-15 NOTE — Op Note (Signed)
[facility]  Patient:    [name], [name]                    MRN: [id number] Proc. Date: 01/11/00 Adm. Date:  [id number] Attending:  Jenean Lindau CC:         Fulton Reek, M.D.  Maretta Bees. Vonita Moss, M.D.   Operative Report  PREOPERATIVE DIAGNOSES: 1. Chronic pelvic pain. 2. Dysmenorrhea. 3. Rule out chronic pelvic inflammatory disease or    endometriosis.  POSTOPERATIVE DIAGNOSES: 1. Chronic pelvic pain with dysmenorrhea. 2. Left peritubal cyst. 3. Moderate serosal endometriosis. 4. Right fallopian tube proximal obstruction.  OPERATION PERFORMED:  Diagnostic laparoscopy with laser ablation of implants, excision of peritubal cyst and tubal dye study.  SURGEON:  Laqueta Linden, M.D.  ANESTHESIA:  General endotracheal.  ESTIMATED BLOOD LOSS:  Less than 10 cc.  SPECIMEN:  Cyst sent to pathology.  COMPLICATIONS:  None.  BRIEF HISTORY:  [name] is a 30 year old nulligravid female, last menstrual period December 31, 1999, who was initially seen as an inpatient consultation in late May where she presented with pelvic pain.  She had been evaluated by Dr. Vonita Moss and had removal of a kidney stone, but had persistent pain.  She had also undergone a GI evaluation by Dr. Dickie La with a flexible sigmoidoscopy which was within normal limits.  Her past history is notable for having undergone a laparoscopy at the age of 69 to rule out appendicitis.  At that time, the appendix was normal.  However, the general surgeon and a gynecologist who looked in on the surgery felt that her right fallopian tube was inflamed.  She was given the diagnosis of pelvic inflammatory disease, however, she had never been sexually active prior to this.  She is to undergo diagnostic laparoscopy at this time for persistent pain with worsening dysmenorrhea to rule out endometriosis, chronic pelvic inflammatory disease, adhesions or other findings.  She has requested  tubal dye studies at the time to assess this situation as well.  She was started on low dose birth control pills with her most recent period due to her severe worsening dysmenorrhea.  Full consent has been given.  The patient has seen the informed consent film and all questions have been answered.  DESCRIPTION OF PROCEDURE:  The patient was taken to the operating room and after proper identification and consent ascertained, she was placed on the operating table in the supine position.  After the induction of general orotracheal anesthesia, she was placed in the [place] stirrups and the abdomen, perineum and vagina were prepped and draped in a routine sterile fashion.  The uterus was noted to be anteriorly deviated to the left, freely mobile and there were no obvious adnexal masses.  A tenaculum was placed on the anterior lip of the cervix.  A transurethral Foley was placed which was removed at the conclusion of the procedure.  Attention was then turned abdominally.  A 2 cm infraumbilical incision was made.  The Veress needle was inserted into the peritoneal cavity. Intraperitoneal placement was confirmed by the hanging drop and saline installation test.  A total of 3-liters of CO2 were infused.  The Veress needle was removed and the laparoscope was inserted.  Inspection at the insertion site revealed no obvious injury, bleeding or adhesions from her prior laparoscopy.  Inspection of the upper abdomen revealed a smooth liver edge with no significant adhesions.  The patient is status post laparoscopic cholecystectomy in the past.  Her  appendix was completely normal in appearance with no evidence of adhesions, endometriosis or other abnormalities.  The patient was then placed in deep Trendelenburg and careful inspection of the pelvis was then performed.  The anterior serosal surface of the uterus was notable for two approximately 5-7 mm implants of endometriosis.  One centrally on the  anterior fundus and one on the lower uterine segment on the left.  The uterus however was freely mobile.  The uterus was then retracted anteriorly and posterior inspection was performed.  The left tube had a benign appearing peritubal cyst on its stalk, but was otherwise completely normal in appearance with a normal appearing fimbriated end.  The left ovary was completely normal in appearance and freely mobile.  There was evidence of endometriosis with peritoneal windows and some scarring noted along the left ureterosacral ligament with one active appearing implant.  There were several small powder burns noted in the cul-de-sac.  The right ureterosacral ligament was clear. The paraovarian fossa on both sides was normal.  The right fallopian tube was somewhat distorted appearing.  The proximal fallopian tube appeared somewhat dilated although it did not appear erythematous.  The distal fallopian tube appeared normal with a normal appearing fimbriated end.  The ovary was freely mobile and the ovarian fossa was free of any lesions.  Tubal dye studies were performed with prompt efflux of blue dye from the left fallopian tube, however, even with compression of the left tube, there was no ability to efflux dye to the right tube.  There was no evidence of chronic inflammation nor PID.  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  At this point, the laser was tested in a routine fashion and the round tip was applied.  All operating room personnel had appropriate eye covering.  The laser was placed on 15 watts.  The previously mentioned endometriosis implants were then vaporized to a depth of 3-5 mm.  The exception was the implant on the left uterosacral ligament which was quite close to the ureter.  The active  appearing  lesion was lasered superficially, but this was not taken deep due to the location of the ureter.  The peritubal cyst on the left was excised and removed and sent to pathology.  There was no active bleeding noted.  Lavage was accomplished.  A total of 200 cc of warm lactated Ringers was left in the pelvis.  There had been a 5 mm suprapubic port placed for additional visualization.  This was done under direct vision.  This was removed with no active bleeding.  The pneumoperitoneum was allowed to escape.  The laparoscope and trocar were then removed as well.  The skin was closed in a routine fashion using subcuticular sutures of 4-0 Dexon.  Steri-Strips and pressure dressings were applied.  The incisions were injected with a total of 10 cc of 1/4% plain Marcaine.  The patient was stable on transfer to the recovery room.  She will be observed and discharged per anesthesia routine.  She was told to follow up in the office in 4-6 weeks time or sooner for excessive pain, fever, bleeding or other concerns.  She was told take Advil or Aleve as needed and given a prescription for Percocet, dispense 20, 1-2 q.4-6h. p.r.n. with no refills and told to continue taking her left pills.  DD:  01/11/00 TD:  01/11/00 Job: [id number]

## 2010-10-15 NOTE — Op Note (Signed)
[place]  Patient:    [name], [name]                    MRN: [id number] Proc. Date: 10/13/99 Adm. Date:  [id number] Attending:  Lorre Nick                           Operative Report  PREOPERATIVE DIAGNOSIS:  Distal left ureteral calculus.  POSTOPERATIVE DIAGNOSIS:  Distal left ureteral calculus.  PROCEDURE:  Cystoscopy and left ureteroscopic stone extraction.  SURGEON:  Maretta Bees. Vonita Moss, M.D.  ANESTHESIA:  General.  INDICATIONS:  This is a 30 year old white female who has had over two weeks of left flank pain, intermittent nausea, and bladder discomfort.  She had a CT scan at the [facility] ER on November 12, 1999 that showed no renal calculi in either ureter but there was a suspected 1-2 mm calculus in the distal left ureter.  She was seen in the office yesterday and was having severe left flank pain.  She had nausea and vomiting.  She has been on [employer].  She had microscopic hematuria and requested intervention at this point.  DESCRIPTION OF PROCEDURE:  The patient was brought to the operating room and placed in the lithotomy position.  External genitalia were prepped and draped in the usual fashion.  She was cystoscoped and the bladder was unremarkable with no stones, tumors, or inflammatory lesions.  I then passed a metal guidewire and felt just mild resistance just inside the ureter and fluoroscopically I could not definitely identify a stone.  I then inserted the 6 French short ureteroscope and got just inside the intramural ureter and still could not see the stone, so then I passed the ureteral dilating sheath into the distal left ureter and then after that, easily introduced the 6 French ureteroscope and I identified a small irregular yellow calculus in the upper part of the lower ureter.  I inserted a Segura stone basket and retrieved this stone and removed it completely.  I then repeated ureteroscopy and there were  no residual stones and no significant trauma to the ureters, so I removed the guidewire, emptied the bladder, and sent the patient to the recovery room in good condition, having tolerated the procedure well.  The stone was given to the patients mother. DD:  10/13/99 TD:  10/16/99 Job: [id number]

## 2010-10-15 NOTE — Op Note (Signed)
NAME:  [name], [name]                       ACCOUNT NO.:  [id number]   MEDICAL RECORD NO.:  [id number]                   PATIENT TYPE:  INP   LOCATION:  9153                                 FACILITY:  [facility]   PHYSICIAN:  Michelle L. Vincente Poli, M.D.            DATE OF BIRTH:  1980/12/06   DATE OF PROCEDURE:  02/02/2002  DATE OF DISCHARGE:                                 OPERATIVE REPORT   PREOPERATIVE DIAGNOSES:  1. Intrauterine pregnancy, 31 1/7 weeks'.  2. Preterm.  3. Chorioamnionitis.  4. Fetal deceleration.   POSTOPERATIVE DIAGNOSIS:  1. Intrauterine pregnancy, 31 1/7 weeks'.  2. Preterm.  3. Chorioamnionitis.  4. Fetal deceleration.  5. Bicornuate uterus.   PROCEDURE:  Primary low transverse cesarean section.   SURGEON:  Michelle L. Vincente Poli, M.D.   ANESTHESIA:  Spinal.   FINDINGS:  Female infant in cephalic presentation, Apgars 6 at one minute, 8  at five minutes, and 10 at 10 minutes intubated.  Murky amniotic fluid was  noted at the time of delivery.   PROCEDURE:  The patient was taken to the operating room.  She then had a  spinal, which was placed.  She was then placed in the dorsal supine position  with a leftward tilt.  The abdomen was prepped and draped in usual sterile  fashion after a Foley catheter was placed in the bladder.  Using a scalpel,  a low transverse incision was made and carried down to the fascia with good  hemostasis.  The fascia was scored in the midline and extended laterally.  A  Pfannenstiel incision was then created.  The rectus muscles were separated  in the midline.  The peritoneum was entered bluntly.  The peritoneal  incision was then stretched.  A bladder blade was inserted.  The lower  uterine segment was identified.  The bladder flap was created sharply and  then digitally and the bladder blade was then readjusted.  Using the  scalpel, the uterus was scored in a low transverse fashion and the uterus  was entered using a hemostat.  The  uterine incision was then extended  laterally.  The baby was in cephalic presentation.  The fluid and the  amniotic sac was noted to be murky in color but there was no foul smell  appreciated.  The baby was delivered easily.  It was female infant with Apgars  6 at one minute, 8 at five minutes, and 10 at 10 minutes and was intubated  in the operating room.  The neonatology was present for the delivery.  The  cord pH was obtained.  Aerobic and anaerobic cultures of the placenta were  then obtained.  The placenta was also removed and sent to pathology for  analysis.  The uterus was cleared of all clots and debris.  The uterus is  inspected and noted be a bicornuate uterus and it appears that the pregnancy  arose from the left  side of the uterus.  There were two fallopian tubes and  there were two ovaries noted and those were normal.  The uterine incision  was closed in a single layer using 0 chromic in a continuous running-lock  fashion  and was hemostatic.  The peritoneum was closed using 0 Vicryl in  continuous running fashion and the rectus muscles were reapproximated using  the same 0 Vicryl.  The fascia was closed using 0  Vicryl in continuous running stitch x2 starting at each corner and meeting  in the midline.  After inspection of the subcutaneous layer, the skin was  closed with staples.  All sponge, lap, and instrument counts were correct  x2.  The patient tolerated the procedure well and went to recovery room in  stable condition.                                                Michelle L. Vincente Poli, M.D.    Florestine Avers  D:  02/02/2002  T:  02/02/2002  Job:  [id number]

## 2010-10-15 NOTE — Discharge Summary (Signed)
[facility]  Patient:    ABBIEGAIL, [name]                    MRN: [id number] Adm. Date:  [id number] Disc. Date: [id number] Attending:  Verneita Young CC:         [facility]             [name]. [name], M.D. [facility]             [name], Ph.D. [facility]             [name], M.D.                           Discharge Summary  ADMISSION DIAGNOSIS:  Abdominal pain of uncertain etiology.  DISCHARGE DIAGNOSES: 1. Abdominal pain of uncertain etiology. 2. Status post recent extraction of left renal calculus. 3. Exogenous obesity. 4. History of laparoscopic cholecystectomy. 5. Remote history of pelvic inflammatory disease.  CONSULTANTS: 1. [name]. [name], M.D. 2. [name]. Dellia Young, Ph.D. 3. [name], M.D.  PROCEDURE:  Flexible sigmoidoscopy.  BRIEF HISTORY:  This is a 30 year old female who was admitted with approximately a four-week history of abdominal pain predominantly in the left upper abdomen with radiation to the left flank.  She had been seen multiple times in the outpatient office, with workup including pelvic exam, cervical cultures and hCG, which were unrevealing; she also had had multiple ER visits with multiple CT scans of the abdomen and pelvis in the week prior to admission.  There was noted a 1- to 2-mm left renal calculus and for this, she had been evaluated by Dr. Maretta Bees. Young prior to admission.  She underwent extraction due to persistent pain five days prior to admission with no resolution of her pain.  Because of her persistent pain, it was felt that further evaluation on admission was necessary.  She had not had any preceding fevers prior to admission and no clear precipitating factors.  ADMISSION LABORATORY DATA:  Urinalysis was negative except for moderate hemoglobin on dip stick.  She had 6-10 rbcs.  Amylase was normal. Electrolytes were all within normal limits.  Her white count was  8600, hemoglobin 11.2, platelet count 211,000.  HOSPITAL COURSE:  Patient was admitted and placed on IV fluids initially.  She was given initially Vicodin and Dilaudid for pain control and Phenergan for nausea.  She underwent abdominal and pelvic ultrasound on admission and this showed a small amount of free pelvic fluid and was felt to probably be physiologic.  No other significant abnormalities were noted.  Consult was obtained with Dr. Lina Young, gastroenterologist, and flexible sigmoidoscopy was scheduled and completed on Oct 19, 1999.  This showed no significant abnormalities.  Consult was also obtained with Dr. Myrlene Young, gynecologist, and it was felt that further outpatient evaluation gynecologically may be indicated, but she may need ultimately outpatient laparoscopy to rule out etiology such as endometriosis, though there was no prior history of her having any cyclical pain.  Etiology of her persistent pain remained in question.  It was felt that this was not likely stemming from her recent small renal stone.  Other potential considerations include irritable bowel syndrome or endometriosis.  It was also felt there may be contributing psychological factors.  Patient related a lot of recent job stress and stress in relationship with her boyfriend.  It was noted that she made very poor  eye contact frequently during the interview and frequently seemed to shake and tremor and react significantly when being examined.  She did, however, noted improvement in her pain overall from time of admission until discharge. Psychology consult was obtained with Dr. Caralyn Young and he recommended further outpatient evaluation to explore her stressors more fully.  The patient was placed on Levsin 0.125 mg sublingually q.8h. prior to discharge. It was felt that no further up of her abdominal pain was indicated at this time, other than the outpatient evaluation as above.  DISCHARGE  MEDICATIONS: 1. Vicodin one to two p.o. q.4-6h. p.r.n. for pain. 2. Levsin 0.125 mg sublingually one q.6-8h. p.r.n.  DISCHARGE CONDITION:  Improved.  FOLLOWUP:  Followup with: 1. Dr. Myrlene Young, and patient is to call for an appointment. 2. [name], Ph.D. -- patient to call for an appointment. 3. [facility] in two to three weeks.  DISCHARGE INSTRUCTIONS:  Patient is to follow up promptly if she develops any fever, increasing abdominal pain or other problems prior to followup. DD:  10/21/99 TD:  10/24/99 Job: [id number]

## 2010-10-15 NOTE — Procedures (Signed)
[facility]  Patient:    [name], [name]                    MRN: [id number] Proc. Date: 10/19/99 Adm. Date:  [id number] Disc. Date: [id number] Attending:  Verneita Griffes CC:         Teena Irani. Arlyce Dice, M.D.                           Procedure Report  PROCEDURE:  Flexible sigmoidoscopy.  INDICATION FOR PROCEDURE:  This 30 year old white female was admitted for severe left sided abdominal pain 1 week post removal of left ureteral stone. She had abdominal pain prior to the urological procedure. It was Dr. Lynda Rainwater opinion that the pain was not related to the calculus. The patient also has a history of PID on the right ovary.  On physical examination, she had extreme tenderness throughout the entire abdomen with hypersensitivity on even light touch. She has been hyperventilating and had been complaining of headache. There has been no fever or leukocytosis. CT scan of the abdomen x 3 was negative. She is undergoing flexible sigmoidoscopy in an attempt to establish or rule out any organic disease.  ENDOSCOPE:  Olympus single channel videoscope.  SEDATION:  Versed 7.5 IV, Demerol 50 mg IV.  FINDINGS:  The Olympus single channel videoscope passed under direct vision through the rectum to the sigmoid colon. The patient was monitored by pulse oximeter. His oxygen saturations were normal. The anal canal and rectal ampulla was unremarkable. The sigmoid colon mucosa was smooth and unremarkable with no evidence of Crohns disease, aphthous ulcers or pseudopolyps. The colonoscope passed easily through the normal appearing sigmoid colon to the descending colon to the splenic flexure to the level of 100 cm through the rectum. There was a normal submucosal vascular pattern and normal thickness of the wall. The colonoscope was then retracted. The patient had minimal discomfort during the procedure.  IMPRESSION:  Normal left colon to splenic flexure.  PLAN:  There  is no evidence of GI pathology at this time between the normal CT scan and normal flexible sigmoidoscopy. I feel that we may dealing either with irritable bowel syndrome or with some other somatic pain that may have to be further evaluated and assessed. I would suggest a psychiatric consultation and also have another urology consultation to rule out any possibility that the ureteral stone could recur. DD:  10/19/99 TD:  10/23/99 Job: [id number]

## 2011-03-08 LAB — STREP A DNA PROBE: Group A Strep Probe: NEGATIVE

## 2011-05-16 ENCOUNTER — Ambulatory Visit (INDEPENDENT_AMBULATORY_CARE_PROVIDER_SITE_OTHER): Payer: Medicaid Other | Admitting: Family Medicine

## 2011-05-16 ENCOUNTER — Encounter: Payer: Self-pay | Admitting: Family Medicine

## 2011-05-16 DIAGNOSIS — R102 Pelvic and perineal pain unspecified side: Secondary | ICD-10-CM

## 2011-05-16 DIAGNOSIS — N949 Unspecified condition associated with female genital organs and menstrual cycle: Secondary | ICD-10-CM

## 2011-05-16 DIAGNOSIS — R109 Unspecified abdominal pain: Secondary | ICD-10-CM

## 2011-05-16 DIAGNOSIS — M25569 Pain in unspecified knee: Secondary | ICD-10-CM

## 2011-05-16 DIAGNOSIS — M25561 Pain in right knee: Secondary | ICD-10-CM

## 2011-05-16 LAB — POCT URINE PREGNANCY: Preg Test, Ur: NEGATIVE

## 2011-05-16 LAB — POCT URINALYSIS DIPSTICK
Bilirubin, UA: NEGATIVE
Glucose, UA: NEGATIVE
Leukocytes, UA: NEGATIVE
Protein, UA: NEGATIVE
Spec Grav, UA: 1.025
Urobilinogen, UA: NEGATIVE
pH, UA: 5

## 2011-05-16 MED ORDER — NAPROXEN 500 MG PO TABS: 500.0000 mg | ORAL_TABLET | Freq: Two times a day (BID) | ORAL | Status: DC

## 2011-05-16 NOTE — Patient Instructions (Signed)
Take anti-inflammatory medication twice daily with food until pain is resolved.  If ongoing/worsening L sided pelvic pain, call for ultrasound.  You likely will need to follow up with your GYN regarding irregular bleeding, and especially if pelvic pain persists.  Knee pain--try to avoid excessive bending/squatting.  Follow up if pain persists after finishing the anti-inflammatory medication. You may follow up sooner if increasing severity in pain, significant swelling and warmth, or other new concerns

## 2011-05-16 NOTE — Progress Notes (Signed)
Patient presents with complaint of left sided abdominal pain since Thursday evening. Dr Susann Givens called in medication over the weekend (Fri night), feeling somewhat better but not 100%. Pain was at L side of abdomen.  Didn't have any urinary symptoms--burning, urgency, frequency.  Was told it was possibly a bladder infection, and prescribed an antibiotic 500 mg twice daily x 3 days.  Had been having normal bowel movements.  Some decrease in appetite and eating, led to decrease in frequency of bowel movements over the last few days.  Yesterday and today had normal stools.     Also complains of  R knee pain.  She has a h/o injury a year ago, but pain had resolved until last week she heard a "pop," and is in pain now. 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  Never swelled.  Has pain with prolonged standing, driving, walking.  Feels better when lying down and elevated.  Hadn't tried any pain medication.  She is also requesting pregancy test as she had a postive home test in July 2012, followed by a negative and has been "spotting" ever since.  She hasn't been sexually active recently.  She has h/o endometriosis. Hasn't seen her GYN since daughter was born 6 years ago.  Last CPE through [facility] a few years ago.   Past Medical History  Diagnosis Date  . Allergy-induced asthma   . Endometriosis   . Kidney stones    Past Surgical History  Procedure Date  . Cesarean section     x2  . Cholecystectomy   . Kidney stone surgery   . Laser treatment for endometriosis age 77  . Laparoscopy age 58   History   Social History  . Marital Status: Single    Spouse Name: N/A    Number of Children: 2  . Years of Education: N/A   Occupational History  .     Social History Main Topics  . Smoking status: Never Smoker   . Smokeless tobacco: Not on file  . Alcohol Use: No  . Drug Use: No  . Sexually Active: Not on file    Other Topics Concern  . Not on file   Social History Narrative   Lives at home with her son and daughter   No current outpatient prescriptions on file prior to visit.   Allergies  Allergen Reactions  . Sulfa Antibiotics Rash   ROS:  Denies fevers, urinary complaints, GI complaints, nausea, vomiting, vaginal discharge, skin rash.  See HPI  PHYSICAL EXAM: BP 102/62  Pulse 76  Ht 5\' 6"  (1.676 m)  Wt 256 lb (116.121 kg)  BMI 41.32 kg/m2  LMP 04/13/2011 Well developed, pleasant obese female in no distress; accompanied by her daughter Heart: regular rate and rhythm Lungs: clear bilaterally Back: no spine or CVA tenderness Abdomen: obese, soft, normal bowel sounds.  Mild tenderness suprapubically, +tenderness at LLQ. No rebound or guarding Pelvic exam--L adnexal tenderness, no mass Knee exam--no effusion or warmth.  Slight pain at mid-patellar tendon with valgus stress, but all ligaments appear intact  Normal urine dip and negative pregnancy test  ASSESSMENT/PLAN:  1. Abdominal pain  POCT Urinalysis Dipstick, POCT urine pregnancy  2. Knee pain, right  naproxen (NAPROSYN) 500 MG tablet  3. Female pelvic pain     L sided in a patient with known endometriosis.  Suspect ovarian cyst. Trial of NSAIDs.  u/s if worsening pain  R knee strain--no evidence of significant internal derangement.  Trial of NSAIDs LLQ abdominal pain.  S/p cipro (maybe?) x 3 days with some improvement.  Persistent L sided pain--suspect ovarian cyst (vs related to endometriosis). Trial of NSAIDs.  If worsening/ongoing pain, will need u/s to r/o cyst or other pathology. Likely will need to f/u with her GYN if ongoing pain/problems.  F/u with GYN if ongoing problems with pelvic pain and abnormal periods

## 2011-10-17 ENCOUNTER — Other Ambulatory Visit: Payer: Self-pay | Admitting: Medical

## 2011-10-17 ENCOUNTER — Encounter: Payer: Self-pay | Admitting: Medical

## 2011-10-17 ENCOUNTER — Ambulatory Visit (INDEPENDENT_AMBULATORY_CARE_PROVIDER_SITE_OTHER): Payer: Self-pay | Admitting: Medical

## 2011-10-17 VITALS — BP 110/78 | Temp 98.1°F | Wt 261.0 lb

## 2011-10-17 DIAGNOSIS — L255 Unspecified contact dermatitis due to plants, except food: Secondary | ICD-10-CM

## 2011-10-17 DIAGNOSIS — N751 Abscess of Bartholin's gland: Secondary | ICD-10-CM

## 2011-10-17 MED ORDER — TRIAMCINOLONE ACETONIDE 0.1 % EX CREA: TOPICAL_CREAM | Freq: Two times a day (BID) | CUTANEOUS | Status: AC

## 2011-10-17 MED ORDER — OXYCODONE-ACETAMINOPHEN 5-325 MG PO TABS: 1.0000 | ORAL_TABLET | Freq: Three times a day (TID) | ORAL | Status: AC | PRN

## 2011-10-17 MED ORDER — AMOXICILLIN-POT CLAVULANATE 875-125 MG PO TABS: 1.0000 | ORAL_TABLET | Freq: Two times a day (BID) | ORAL | Status: AC

## 2011-10-17 NOTE — Patient Instructions (Signed)
Bartholin's Cyst and Abscess  Bartholin's glands produce mucus through small openings just outside the opening of the vagina. The mucus helps with lubrication around the vagina during sexual intercourse. If the duct becomes clogged, the gland will swell and cause a bulge on the inside of the vagina. If this becomes big enough, it can be seen and felt on the outside of the vagina as well. Sometimes, the swelling will shrink away by itself. However, if the cyst becomes infected, the Bartholin's cyst fills with pus and becomes more swollen, red and painful and becomes a Bartholin's abscess. This usually requires antibiotic treatment and surgical drainage. Sometimes, with minor surgery under local anesthesia, a small tube is placed in the cyst or abscess wall. This allows continued drainage for up to 6 weeks. Minor surgery can make a new opening to replace the clogged duct and help prevent future cysts or abscess.  If the abscess occurs several times, a minor operation with local anesthesia is necessary to remove the Bartholin's gland completely or to make it drain better. Cutting open the gland and suturing the edges to make the opening of the gland bigger (marsupialization) may be needed and should usually be done by your obstetrician-gyncology physician. Antibiotics are usually prescribed for this condition. Take all antibiotics as prescribed. Make sure to finish them even if you are doing better. Take warm sitz baths for 20 minutes, 3 times a day. See your caregiver for follow-up care as recommended.  SEEK MEDICAL CARE IF:    You have increasing pain, swelling, or redness near the vagina.   You have vomiting or inability to tolerate medicines.   You have a fever.   You have uncontrolled bleeding from the vagina.  Document Released: 06/23/2004 Document Revised: 05/05/2011 Document Reviewed: 06/26/2009  [employer] Patient Information 2012 [employer].

## 2011-10-17 NOTE — Progress Notes (Signed)
Subjective:  [name] is a 31 y.o. female who presents for 2 c/o.   She first notes possible poison oak exposure.  She was out mowing recently and yesterday started getting rash on feet, and now spread to legs.  Using calamine lotion.    She report probable cutaneous abscess. Lesion is located in the genital region. Onset was 1-2 week ago. Symptoms have gradually worsened.  Abscess has associated symptoms of pain. Patient does have previous history of cutaneous abscesses x 1 several years ago.  Objective:   Gen: wd, wn Skin: feet and legs with scattered erythematous 1mm papular lesions, which white chalky medication applied Gyn: right bartholin's region with fluctuant 2 cm mass, erythema, slight induration  Procedure Informed consent obtained.  The area was prepped in the usual manner and the skin overlying the abscess was anesthetized with 2 cc of 2% plain lidocaine.  The area was sharply incised and approx 1ccs of purulent material was obtained.  Area was irrigated with high pressure saline. Packing was inserted. Wound was covered with sterile bandage.     Exam and procedure chaperoned by nurse   Assessment:   Encounter Diagnoses  Name Primary?  . Bartholin's gland abscess Yes  . Plant dermatitis     Plan:   bartholin's gland abscess - discussed case with supervising physician.  I&D procedure as above.  Advised pt apply hot compresses frequently to promote drainage.  Oral antibiotics, Percocet for pain prn -- see med orders.  F/u 3 days.  Plant dermatitis - topical triamcinolone cream, benadryl OTC, call if not improving.

## 2011-10-20 ENCOUNTER — Ambulatory Visit: Payer: Self-pay | Admitting: Medical

## 2011-10-20 LAB — WOUND CULTURE
Gram Stain: NONE SEEN
Gram Stain: NONE SEEN
Organism ID, Bacteria: NO GROWTH

## 2011-12-12 ENCOUNTER — Telehealth (HOSPITAL_COMMUNITY): Payer: Self-pay

## 2011-12-12 NOTE — Telephone Encounter (Signed)
Would like to discuss medication.

## 2012-05-20 ENCOUNTER — Emergency Department (HOSPITAL_BASED_OUTPATIENT_CLINIC_OR_DEPARTMENT_OTHER)
Admission: EM | Admit: 2012-05-20 | Discharge: 2012-05-20 | Disposition: A | Discharge status: Home / Self Care | Payer: Self-pay | Attending: Emergency Medicine | Admitting: Emergency Medicine

## 2012-05-20 ENCOUNTER — Encounter (HOSPITAL_BASED_OUTPATIENT_CLINIC_OR_DEPARTMENT_OTHER): Payer: Self-pay | Admitting: Emergency Medicine

## 2012-05-20 DIAGNOSIS — J3489 Other specified disorders of nose and nasal sinuses: Secondary | ICD-10-CM | POA: Insufficient documentation

## 2012-05-20 DIAGNOSIS — Z79899 Other long term (current) drug therapy: Secondary | ICD-10-CM | POA: Insufficient documentation

## 2012-05-20 DIAGNOSIS — R5381 Other malaise: Secondary | ICD-10-CM | POA: Insufficient documentation

## 2012-05-20 DIAGNOSIS — R112 Nausea with vomiting, unspecified: Secondary | ICD-10-CM | POA: Insufficient documentation

## 2012-05-20 DIAGNOSIS — J029 Acute pharyngitis, unspecified: Secondary | ICD-10-CM | POA: Insufficient documentation

## 2012-05-20 DIAGNOSIS — R059 Cough, unspecified: Secondary | ICD-10-CM | POA: Insufficient documentation

## 2012-05-20 DIAGNOSIS — R05 Cough: Secondary | ICD-10-CM | POA: Insufficient documentation

## 2012-05-20 DIAGNOSIS — J45909 Unspecified asthma, uncomplicated: Secondary | ICD-10-CM | POA: Insufficient documentation

## 2012-05-20 DIAGNOSIS — Z8742 Personal history of other diseases of the female genital tract: Secondary | ICD-10-CM | POA: Insufficient documentation

## 2012-05-20 DIAGNOSIS — Z87442 Personal history of urinary calculi: Secondary | ICD-10-CM | POA: Insufficient documentation

## 2012-05-20 DIAGNOSIS — R0982 Postnasal drip: Secondary | ICD-10-CM | POA: Insufficient documentation

## 2012-05-20 DIAGNOSIS — IMO0001 Reserved for inherently not codable concepts without codable children: Secondary | ICD-10-CM | POA: Insufficient documentation

## 2012-05-20 DIAGNOSIS — R509 Fever, unspecified: Secondary | ICD-10-CM | POA: Insufficient documentation

## 2012-05-20 LAB — RAPID STREP SCREEN (MED CTR MEBANE ONLY): Streptococcus, Group A Screen (Direct): NEGATIVE

## 2012-05-20 MED ORDER — DEXAMETHASONE SODIUM PHOSPHATE 10 MG/ML IJ SOLN
10.0000 mg | Freq: Once | INTRAMUSCULAR | Status: AC
  Administered 2012-05-20: 10 mg via INTRAMUSCULAR
  Filled 2012-05-20: qty 1

## 2012-05-20 MED ORDER — FEXOFENADINE-PSEUDOEPHED ER 180-240 MG PO TB24: 1.0000 | ORAL_TABLET | Freq: Every day | ORAL | Status: DC

## 2012-05-20 NOTE — ED Notes (Signed)
Pt c/o sore throat x 3 weeks; some NVD since Thurs.

## 2012-05-20 NOTE — ED Provider Notes (Signed)
History     CSN: [id number]  Arrival date & time 05/20/12  4098   First MD Initiated Contact with Patient 05/20/12 [phone]      Chief Complaint  Patient presents with  . Sore Throat    (Consider location/radiation/quality/duration/timing/severity/associated sxs/prior treatment) HPI Comments: Patient complains of a three-week history of worsening sore throat. She's had some nausea and vomiting. She's had some low-grade fever she says. She says it hurts to swallow. She's been using over-the-counter throat sprays with some relief although she says the pain got worse for the last 2 days. She denies any trouble breathing or airway swelling. She denies any cough or chest congestion. She has had some runny nose and nasal congestion. She denies any known sick contacts. She denies any skin rashes.  Patient is a 31 y.o. female presenting with pharyngitis.  Sore Throat Pertinent negatives include no chest pain, no abdominal pain, no headaches and no shortness of breath.    Past Medical History  Diagnosis Date  . Allergy-induced asthma   . Endometriosis   . Kidney stones     Past Surgical History  Procedure Date  . Cesarean section     x2  . Cholecystectomy   . Kidney stone surgery   . Laser treatment for endometriosis age 23  . Laparoscopy age 39    No family history on file.  History  Substance Use Topics  . Smoking status: Never Smoker   . Smokeless tobacco: Not on file  . Alcohol Use: No    OB History    Grav Para Term Preterm Abortions TAB SAB Ect Mult Living                  Review of Systems  Constitutional: Positive for fever and fatigue. Negative for chills and diaphoresis.  HENT: Positive for congestion, sore throat, rhinorrhea and postnasal drip. Negative for sneezing and trouble swallowing.   Eyes: Negative.   Respiratory: Positive for cough (mild). Negative for chest tightness and shortness of breath.   Cardiovascular: Negative for chest pain and leg  swelling.  Gastrointestinal: Positive for nausea and vomiting. Negative for abdominal pain, diarrhea and blood in stool.  Genitourinary: Negative for frequency, hematuria, flank pain and difficulty urinating.  Musculoskeletal: Positive for myalgias. Negative for back pain and arthralgias.  Skin: Negative for rash.  Neurological: Negative for dizziness, speech difficulty, weakness, numbness and headaches.    Allergies  Sulfa antibiotics  Home Medications   Current Outpatient Rx  Name  Route  Sig  Dispense  Refill  . FEXOFENADINE-PSEUDOEPHED ER 180-240 MG PO TB24   Oral   Take 1 tablet by mouth daily.   20 tablet   0   . TRIAMCINOLONE ACETONIDE 0.1 % EX CREA   Topical   Apply topically 2 (two) times daily.   30 g   0     BP 130/89  Pulse 74  Temp 98.3 F (36.8 C) (Oral)  Resp 20  Ht 5\' 5"  (1.651 m)  Wt 268 lb (121.564 kg)  BMI 44.60 kg/m2  SpO2 99%  LMP 04/29/2012  Physical Exam  Constitutional: She is oriented to person, place, and time. She appears well-developed and well-nourished.  HENT:  Head: Normocephalic and atraumatic.  Right Ear: External ear normal.  Left Ear: External ear normal.  Nose: Nose normal.       Mild erythema to the posterior pharynx. No. Tonsillar fullness. Uvula is midline. No elevation of the tongue. No trismus. There is mild cervical  lymphadenopathy  Eyes: Pupils are equal, round, and reactive to light.  Neck: Normal range of motion. Neck supple.  Cardiovascular: Normal rate, regular rhythm and normal heart sounds.   Pulmonary/Chest: Effort normal and breath sounds normal. No respiratory distress. She has no wheezes. She has no rales. She exhibits no tenderness.  Abdominal: Soft. Bowel sounds are normal. There is no tenderness. There is no rebound and no guarding.  Musculoskeletal: Normal range of motion. She exhibits no edema.  Lymphadenopathy:    She has cervical adenopathy.  Neurological: She is alert and oriented to person, place,  and time.  Skin: Skin is warm and dry. No rash noted.  Psychiatric: She has a normal mood and affect.    ED Course  Procedures (including critical care time)  Results for orders placed during the hospital encounter of 05/20/12  RAPID STREP SCREEN      Component Value Range   Streptococcus, Group A Screen (Direct) NEGATIVE  NEGATIVE   No results found.    1. Pharyngitis       MDM  Patient with a three-week history of sore throat. Her strep was negative. I don't see any lesions on her throat. I don't see any evidence of peritonsillar abscess. She has no evident airway issues. I would have a very low suspicion of epiglottitis given it's been going on for 3 weeks and she is well-appearing. I feel this is likely related to postnasal drip and we'll give her a prescription for Allegra-D as well as a shot of Decadron for the inflammation. I advised her to followup with her primary care physician if her symptoms do not improve within the next 2 days or return here as needed for any worsening symptoms.        Rolan Bucco, MD 05/20/12 [phone]

## 2012-08-27 ENCOUNTER — Emergency Department (HOSPITAL_BASED_OUTPATIENT_CLINIC_OR_DEPARTMENT_OTHER)
Admission: EM | Admit: 2012-08-27 | Discharge: 2012-08-27 | Disposition: A | Discharge status: Home / Self Care | Payer: Self-pay | Attending: Emergency Medicine | Admitting: Emergency Medicine

## 2012-08-27 ENCOUNTER — Encounter (HOSPITAL_BASED_OUTPATIENT_CLINIC_OR_DEPARTMENT_OTHER): Payer: Self-pay | Admitting: *Deleted

## 2012-08-27 DIAGNOSIS — J45909 Unspecified asthma, uncomplicated: Secondary | ICD-10-CM | POA: Insufficient documentation

## 2012-08-27 DIAGNOSIS — Z87442 Personal history of urinary calculi: Secondary | ICD-10-CM | POA: Insufficient documentation

## 2012-08-27 DIAGNOSIS — L237 Allergic contact dermatitis due to plants, except food: Secondary | ICD-10-CM

## 2012-08-27 DIAGNOSIS — Z8742 Personal history of other diseases of the female genital tract: Secondary | ICD-10-CM | POA: Insufficient documentation

## 2012-08-27 DIAGNOSIS — L255 Unspecified contact dermatitis due to plants, except food: Secondary | ICD-10-CM | POA: Insufficient documentation

## 2012-08-27 DIAGNOSIS — Z79899 Other long term (current) drug therapy: Secondary | ICD-10-CM | POA: Insufficient documentation

## 2012-08-27 MED ORDER — PREDNISONE 20 MG PO TABS: 40.0000 mg | ORAL_TABLET | Freq: Every day | ORAL | Status: DC

## 2012-08-27 MED ORDER — PREDNISONE 10 MG PO TABS
60.0000 mg | ORAL_TABLET | Freq: Once | ORAL | Status: AC
  Administered 2012-08-27: 60 mg via ORAL
  Filled 2012-08-27: qty 1

## 2012-08-27 NOTE — ED Provider Notes (Signed)
History    This chart was scribed for Gwyneth Sprout, MD by Marlyne Beards, ED Scribe. The patient was seen in room MHTR1/MHTR1. Patient's care was started at 8:56 PM .    CSN: [id number]  Arrival date & time 08/27/12  2032   First MD Initiated Contact with Patient 08/27/12 2056      Chief Complaint  Patient presents with  . Rash    (Consider location/radiation/quality/duration/timing/severity/associated sxs/prior treatment) Patient is a 32 y.o. female presenting with rash. The history is provided by the patient. No language interpreter was used.  Rash Location:  Full body Quality: blistering and itchiness   Severity:  Moderate Onset quality:  Sudden Duration:  1 day Timing:  Constant Progression:  Spreading Chronicity:  New  [name] is a 32 y.o. female who presents to the Emergency Department complaining of moderate constant irritation due to a blistery rash over her arms and chest onset yesterday. Pt was working in the yard yesterday when today she noticed a raised itchy rash that has started to spread all over her body. Pt is unsure of what she got into. Pt denies any trouble breathing/swallowing, fever, chills, cough, nausea, vomiting, diarrhea, SOB, weakness, and any other associated symptoms. Pt's PCP is [place].   Past Medical History  Diagnosis Date  . Allergy-induced asthma   . Endometriosis   . Kidney stones     Past Surgical History  Procedure Laterality Date  . Cesarean section      x2  . Cholecystectomy    . Kidney stone surgery    . Laser treatment for endometriosis  age 17  . Laparoscopy  age 59    No family history on file.  History  Substance Use Topics  . Smoking status: Never Smoker   . Smokeless tobacco: Not on file  . Alcohol Use: No    OB History   Grav Para Term Preterm Abortions TAB SAB Ect Mult Living                  Review of Systems  Skin: Positive for rash.  All other systems reviewed and are  negative.    Allergies  Sulfa antibiotics  Home Medications   Current Outpatient Rx  Name  Route  Sig  Dispense  Refill  . fexofenadine-pseudoephedrine (ALLEGRA-D 24 HOUR) 180-240 MG per 24 hr tablet   Oral   Take 1 tablet by mouth daily.   20 tablet   0   . triamcinolone cream (KENALOG) 0.1 %   Topical   Apply topically 2 (two) times daily.   30 g   0     BP 128/79  Pulse 90  Temp(Src) 98.9 F (37.2 C) (Oral)  Resp 16  Ht 5\' 6"  (1.676 m)  Wt 250 lb (113.399 kg)  BMI 40.37 kg/m2  SpO2 95%  Physical Exam  Nursing note and vitals reviewed. Constitutional: She appears well-developed and well-nourished. No distress.  HENT:  Head: Normocephalic and atraumatic.  Eyes: EOM are normal. Pupils are equal, round, and reactive to light.  Neck: Normal range of motion. Neck supple.  Cardiovascular: Normal rate.   Pulmonary/Chest: Effort normal. No respiratory distress.  Abdominal: Soft. Bowel sounds are normal. There is no tenderness. There is no guarding.  Musculoskeletal: Normal range of motion. She exhibits no tenderness.  Neurological: She is alert.  Skin: Skin is warm and dry. Rash noted.  Pustular, vesicular, and erythematous rash in linear patchy distribution over the upper extremity and ankles of  pt's body.  Psychiatric: She has a normal mood and affect. Her behavior is normal.    ED Course  Procedures (including critical care time) DIAGNOSTIC STUDIES: Oxygen Saturation is 95% on room air, adequate by my interpretation.    COORDINATION OF CARE: 9:10 PM Discussed ED treatment with pt and pt agrees.     Labs Reviewed - No data to display No results found.   1. Contact dermatitis due to poison oak       MDM   Pt with findings consistent the poison oak dermatitis.  No facial or airway involvement.  Pt d/ced home with po steroids. I personally performed the services described in this documentation, which was scribed in my presence.  The recorded  information has been reviewed and considered.         Gwyneth Sprout, MD 08/27/12 2120

## 2012-08-27 NOTE — ED Notes (Signed)
Blistery rash over her arms and chest since working in the yard yesterday.

## 2012-09-11 ENCOUNTER — Encounter (HOSPITAL_BASED_OUTPATIENT_CLINIC_OR_DEPARTMENT_OTHER): Payer: Self-pay | Admitting: *Deleted

## 2012-09-11 ENCOUNTER — Emergency Department (HOSPITAL_BASED_OUTPATIENT_CLINIC_OR_DEPARTMENT_OTHER)
Admission: EM | Admit: 2012-09-11 | Discharge: 2012-09-11 | Disposition: A | Discharge status: Home / Self Care | Payer: Self-pay | Attending: Emergency Medicine | Admitting: Emergency Medicine

## 2012-09-11 DIAGNOSIS — L259 Unspecified contact dermatitis, unspecified cause: Secondary | ICD-10-CM | POA: Insufficient documentation

## 2012-09-11 DIAGNOSIS — Z87442 Personal history of urinary calculi: Secondary | ICD-10-CM | POA: Insufficient documentation

## 2012-09-11 DIAGNOSIS — J45909 Unspecified asthma, uncomplicated: Secondary | ICD-10-CM | POA: Insufficient documentation

## 2012-09-11 DIAGNOSIS — Z8742 Personal history of other diseases of the female genital tract: Secondary | ICD-10-CM | POA: Insufficient documentation

## 2012-09-11 MED ORDER — DIPHENHYDRAMINE HCL 50 MG/ML IJ SOLN
25.0000 mg | Freq: Once | INTRAMUSCULAR | Status: AC
  Administered 2012-09-11: 25 mg via INTRAVENOUS
  Filled 2012-09-11: qty 1

## 2012-09-11 MED ORDER — PREDNISONE 20 MG PO TABS: 40.0000 mg | ORAL_TABLET | Freq: Every day | ORAL | Status: DC

## 2012-09-11 MED ORDER — RANITIDINE HCL 150 MG/10ML PO SYRP
150.0000 mg | ORAL_SOLUTION | Freq: Once | ORAL | Status: AC
  Administered 2012-09-11: 150 mg via ORAL
  Filled 2012-09-11: qty 10

## 2012-09-11 MED ORDER — METHYLPREDNISOLONE SODIUM SUCC 125 MG IJ SOLR
125.0000 mg | Freq: Once | INTRAMUSCULAR | Status: AC
  Administered 2012-09-11: 125 mg via INTRAVENOUS
  Filled 2012-09-11: qty 2

## 2012-09-11 NOTE — ED Notes (Signed)
Woke up at 0400 this am itching but did not notice any rash or redness at 0900 this am she felt like her face was burning and itching looked in mirror her face bright red states she hasnt used any new products no difficulty breathing

## 2012-09-11 NOTE — ED Provider Notes (Signed)
History     CSN: [id number]  Arrival date & time 09/11/12  1537   First MD Initiated Contact with Patient 09/11/12 1545      Chief Complaint  Patient presents with  . Rash    (Consider location/radiation/quality/duration/timing/severity/associated sxs/prior treatment) HPI Comments: Pt states that she broke out on her face this morning with redness:pt states that her face is itching and burning:pt states that she just got off a 14 day dose of prednisone for poison oak:pt denies any rash other places:pt states that she did take a supplement for wt loss this morning which is new:pt states that she may have also had another exposure to poison oak:pt denies oral swelling or sob:pt states that she has also been working in the woods again  The history is provided by the patient. No language interpreter was used.    Past Medical History  Diagnosis Date  . Allergy-induced asthma   . Endometriosis   . Kidney stones     Past Surgical History  Procedure Laterality Date  . Cesarean section      x2  . Cholecystectomy    . Kidney stone surgery    . Laser treatment for endometriosis  age 26  . Laparoscopy  age 73    History reviewed. No pertinent family history.  History  Substance Use Topics  . Smoking status: Never Smoker   . Smokeless tobacco: Not on file  . Alcohol Use: No    OB History   Grav Para Term Preterm Abortions TAB SAB Ect Mult Living                  Review of Systems  Constitutional: Negative.   Respiratory: Negative for apnea.   Cardiovascular: Negative.     Allergies  Sulfa antibiotics  Home Medications   Current Outpatient Rx  Name  Route  Sig  Dispense  Refill  . fexofenadine-pseudoephedrine (ALLEGRA-D 24 HOUR) 180-240 MG per 24 hr tablet   Oral   Take 1 tablet by mouth daily.   20 tablet   0   . predniSONE (DELTASONE) 20 MG tablet   Oral   Take 2 tablets (40 mg total) by mouth daily. Take 2 tabs po daily for 7 days and then 1 tab po daily  for 7 days   21 tablet   0   . triamcinolone cream (KENALOG) 0.1 %   Topical   Apply topically 2 (two) times daily.   30 g   0     BP 150/90  Pulse 85  Temp(Src) 98.4 F (36.9 C) (Oral)  Resp 18  Ht 5\' 6"  (1.676 m)  Wt 250 lb (113.399 kg)  BMI 40.37 kg/m2  SpO2 98%  LMP 09/06/2012  Physical Exam  Vitals reviewed. Constitutional: She is oriented to person, place, and time. She appears well-developed and well-nourished.  HENT:  Head: Normocephalic and atraumatic.  No oral or mucosal swelling  Cardiovascular: Normal rate and regular rhythm.   Pulmonary/Chest: Effort normal and breath sounds normal.  Musculoskeletal: Normal range of motion.  Neurological: She is alert and oriented to person, place, and time.  Skin:  Pts face is bright red    ED Course  Procedures (including critical care time)  Labs Reviewed - No data to display No results found.   1. Contact dermatitis       MDM  Red has decreased on face and pt states that it is not burning as bad:pt is okay to follow up as  needed:pt has no oral compromise:pt given another course of steriods        Teressa Lower, NP 09/11/12 1730

## 2012-09-11 NOTE — ED Provider Notes (Signed)
Medical screening examination/treatment/procedure(s) were performed by non-physician practitioner and as supervising physician I was immediately available for consultation/collaboration.   Armin Yerger, MD 09/11/12 2016 

## 2012-09-13 ENCOUNTER — Encounter (HOSPITAL_BASED_OUTPATIENT_CLINIC_OR_DEPARTMENT_OTHER): Payer: Self-pay | Admitting: Emergency Medicine

## 2012-09-13 ENCOUNTER — Emergency Department (HOSPITAL_BASED_OUTPATIENT_CLINIC_OR_DEPARTMENT_OTHER)
Admission: EM | Admit: 2012-09-13 | Discharge: 2012-09-13 | Disposition: A | Discharge status: Home / Self Care | Payer: Self-pay | Attending: Emergency Medicine | Admitting: Emergency Medicine

## 2012-09-13 DIAGNOSIS — Y939 Activity, unspecified: Secondary | ICD-10-CM | POA: Insufficient documentation

## 2012-09-13 DIAGNOSIS — Z8742 Personal history of other diseases of the female genital tract: Secondary | ICD-10-CM | POA: Insufficient documentation

## 2012-09-13 DIAGNOSIS — Z87442 Personal history of urinary calculi: Secondary | ICD-10-CM | POA: Insufficient documentation

## 2012-09-13 DIAGNOSIS — R51 Headache: Secondary | ICD-10-CM | POA: Insufficient documentation

## 2012-09-13 DIAGNOSIS — Z79899 Other long term (current) drug therapy: Secondary | ICD-10-CM | POA: Insufficient documentation

## 2012-09-13 DIAGNOSIS — IMO0002 Reserved for concepts with insufficient information to code with codable children: Secondary | ICD-10-CM | POA: Insufficient documentation

## 2012-09-13 DIAGNOSIS — J45909 Unspecified asthma, uncomplicated: Secondary | ICD-10-CM | POA: Insufficient documentation

## 2012-09-13 DIAGNOSIS — T7840XA Allergy, unspecified, initial encounter: Secondary | ICD-10-CM

## 2012-09-13 DIAGNOSIS — L255 Unspecified contact dermatitis due to plants, except food: Secondary | ICD-10-CM | POA: Insufficient documentation

## 2012-09-13 DIAGNOSIS — Y929 Unspecified place or not applicable: Secondary | ICD-10-CM | POA: Insufficient documentation

## 2012-09-13 MED ORDER — EPINEPHRINE 0.3 MG/0.3ML IJ DEVI: 0.3000 mg | INTRAMUSCULAR | Status: DC | PRN

## 2012-09-13 MED ORDER — DEXAMETHASONE SODIUM PHOSPHATE 10 MG/ML IJ SOLN
10.0000 mg | Freq: Once | INTRAMUSCULAR | Status: AC
  Administered 2012-09-13: 10 mg via INTRAVENOUS
  Filled 2012-09-13: qty 1

## 2012-09-13 MED ORDER — METHYLPREDNISOLONE 4 MG PO KIT: PACK | ORAL | Status: DC

## 2012-09-13 MED ORDER — DIPHENHYDRAMINE HCL 50 MG/ML IJ SOLN
25.0000 mg | Freq: Once | INTRAMUSCULAR | Status: AC
  Administered 2012-09-13: 25 mg via INTRAVENOUS
  Filled 2012-09-13: qty 1

## 2012-09-13 MED ORDER — FAMOTIDINE IN NACL 20-0.9 MG/50ML-% IV SOLN
20.0000 mg | Freq: Once | INTRAVENOUS | Status: AC
  Administered 2012-09-13: 20 mg via INTRAVENOUS
  Filled 2012-09-13: qty 50

## 2012-09-13 NOTE — ED Notes (Signed)
Pt c/o facial swelling since Tuesday and "throat swelling x 1 hr ago". Pt states seen here Tuesday given RX prednisone for poison ivy. Pt states 25mg  benadryl PTA approx 2100.Pt ambulatory to triage. Airway patent, speech clear, in NAD.

## 2012-09-13 NOTE — ED Notes (Signed)
MD at bedside. 

## 2012-09-13 NOTE — ED Provider Notes (Signed)
History     CSN: [id number]  Arrival date & time 09/13/12  0436   First MD Initiated Contact with Patient 09/13/12 0451      Chief Complaint  Patient presents with  . Facial Swelling    (Consider location/radiation/quality/duration/timing/severity/associated sxs/prior treatment) Patient is a 32 y.o. female presenting with rash. The history is provided by the patient. No language interpreter was used.  Rash Location:  Face Facial rash location:  Face Quality: itchiness, redness and swelling   Quality: not blistering, not bruising, not draining, not painful and not weeping   Severity:  Moderate Onset quality:  Gradual Duration:  2 days Timing:  Constant Progression:  Worsening Chronicity:  New Context comment:  Unknown, thought it was poison oak she was exposed to on 3/31 which was on arms but did use new facial cream and new soap.   Associated symptoms: no shortness of breath and not wheezing   Face markedly more red this evening and feels swollen.  Feels tight but no SOB no swelling of the lips tongue or uvula.  No drooling no stridor no wheezing no change in voice able to swallow easily and without difficulty  Past Medical History  Diagnosis Date  . Allergy-induced asthma   . Endometriosis   . Kidney stones     Past Surgical History  Procedure Laterality Date  . Cesarean section      x2  . Cholecystectomy    . Kidney stone surgery    . Laser treatment for endometriosis  age 48  . Laparoscopy  age 56    No family history on file.  History  Substance Use Topics  . Smoking status: Never Smoker   . Smokeless tobacco: Not on file  . Alcohol Use: No    OB History   Grav Para Term Preterm Abortions TAB SAB Ect Mult Living                  Review of Systems  Constitutional: Negative for diaphoresis.  HENT: Positive for facial swelling.   Respiratory: Negative for cough, shortness of breath, wheezing and stridor.   Skin: Positive for rash.  All other  systems reviewed and are negative.    Allergies  Sulfa antibiotics  Home Medications   Current Outpatient Rx  Name  Route  Sig  Dispense  Refill  . fexofenadine-pseudoephedrine (ALLEGRA-D 24 HOUR) 180-240 MG per 24 hr tablet   Oral   Take 1 tablet by mouth daily.   20 tablet   0   . predniSONE (DELTASONE) 20 MG tablet   Oral   Take 2 tablets (40 mg total) by mouth daily. Take 2 tabs po daily for 7 days and then 1 tab po daily for 7 days   21 tablet   0   . triamcinolone cream (KENALOG) 0.1 %   Topical   Apply topically 2 (two) times daily.   30 g   0     BP 134/88  Pulse 80  Temp(Src) 98 F (36.7 C) (Oral)  Resp 20  Ht 5\' 6"  (1.676 m)  Wt 250 lb (113.399 kg)  BMI 40.37 kg/m2  SpO2 100%  LMP 09/06/2012  Physical Exam  Constitutional: She is oriented to person, place, and time. She appears well-developed and well-nourished. No distress.  HENT:  Mouth/Throat: Oropharynx is clear and moist.  No swelling of the lips tongue or uvula, no posterior oral swelling no elevation of the floor of the mouth nor the submandibular space.  Intact phonation  Eyes: Conjunctivae are normal. Pupils are equal, round, and reactive to light.  Neck: Normal range of motion. Neck supple. No tracheal deviation present.  Cardiovascular: Normal rate, regular rhythm and intact distal pulses.   Pulmonary/Chest: Effort normal and breath sounds normal. No stridor. No respiratory distress. She has no wheezes. She has no rales.  Abdominal: Soft. Bowel sounds are normal. There is no tenderness. There is no rebound and no guarding.  Musculoskeletal: Normal range of motion.  Lymphadenopathy:    She has no cervical adenopathy.  Neurological: She is alert and oriented to person, place, and time.  Skin: Skin is warm and dry. There is erythema.  Lesions on the forearms b consistent with tried and dried poison dermatitis   face has confluent papules and erythroderma and mild symmetric swelling   Psychiatric: She has a normal mood and affect.    ED Course  Procedures (including critical care time)  Labs Reviewed - No data to display No results found.   No diagnosis found.    MDM  Given dexamethasone, pepcid and benadryl with decreased in swelling and resolution of erythroderma.  Phonation intact and patient feels markedly improved.  Will change steroids to medrol dose pack and have advised pepcid ac BID x 7 days for added anti histamine coverage in addition to q 6 hr benadryl.  Will prescribe EPI pen.  D/c soap and new lotion.  Follow up with your PMD and will refer to allergist.  Return for worsening symptoms.  Patient verbalizes understanding and agrees to follow up        Pasqualino Witherspoon Smitty Cords, MD 09/13/12 [phone]

## 2012-09-18 ENCOUNTER — Encounter: Payer: Self-pay | Admitting: Family Medicine

## 2013-07-05 ENCOUNTER — Other Ambulatory Visit: Payer: Self-pay

## 2013-07-05 ENCOUNTER — Other Ambulatory Visit (HOSPITAL_COMMUNITY)
Admission: RE | Admit: 2013-07-05 | Discharge: 2013-07-05 | Disposition: A | Discharge status: Home / Self Care | Payer: BC Managed Care – PPO | Source: Ambulatory Visit | Attending: Family Medicine | Admitting: Family Medicine

## 2013-07-05 DIAGNOSIS — Z01419 Encounter for gynecological examination (general) (routine) without abnormal findings: Secondary | ICD-10-CM | POA: Insufficient documentation

## 2013-09-05 ENCOUNTER — Telehealth: Payer: Self-pay | Admitting: Internal Medicine

## 2013-09-05 NOTE — Telephone Encounter (Signed)
Faxed over medical records to [facility] @ [place] @ [phone] 

## 2014-01-04 ENCOUNTER — Encounter (HOSPITAL_BASED_OUTPATIENT_CLINIC_OR_DEPARTMENT_OTHER): Payer: Self-pay | Admitting: Emergency Medicine

## 2014-01-04 ENCOUNTER — Emergency Department (HOSPITAL_BASED_OUTPATIENT_CLINIC_OR_DEPARTMENT_OTHER)
Admission: EM | Admit: 2014-01-04 | Discharge: 2014-01-04 | Disposition: A | Discharge status: Home / Self Care | Payer: BC Managed Care – PPO | Attending: Emergency Medicine | Admitting: Emergency Medicine

## 2014-01-04 DIAGNOSIS — A499 Bacterial infection, unspecified: Secondary | ICD-10-CM | POA: Diagnosis not present

## 2014-01-04 DIAGNOSIS — N760 Acute vaginitis: Secondary | ICD-10-CM | POA: Diagnosis not present

## 2014-01-04 DIAGNOSIS — Z87442 Personal history of urinary calculi: Secondary | ICD-10-CM | POA: Insufficient documentation

## 2014-01-04 DIAGNOSIS — B9689 Other specified bacterial agents as the cause of diseases classified elsewhere: Secondary | ICD-10-CM | POA: Diagnosis not present

## 2014-01-04 DIAGNOSIS — Z79899 Other long term (current) drug therapy: Secondary | ICD-10-CM | POA: Diagnosis not present

## 2014-01-04 DIAGNOSIS — R102 Pelvic and perineal pain: Secondary | ICD-10-CM

## 2014-01-04 DIAGNOSIS — Z3202 Encounter for pregnancy test, result negative: Secondary | ICD-10-CM | POA: Diagnosis not present

## 2014-01-04 DIAGNOSIS — J45909 Unspecified asthma, uncomplicated: Secondary | ICD-10-CM | POA: Diagnosis not present

## 2014-01-04 DIAGNOSIS — N898 Other specified noninflammatory disorders of vagina: Secondary | ICD-10-CM | POA: Insufficient documentation

## 2014-01-04 HISTORY — DX: Endometriosis, unspecified: N80.9

## 2014-01-04 LAB — URINALYSIS, ROUTINE W REFLEX MICROSCOPIC
BILIRUBIN URINE: NEGATIVE
GLUCOSE, UA: NEGATIVE mg/dL
KETONES UR: NEGATIVE mg/dL
Nitrite: NEGATIVE
PH: 6 (ref 5.0–8.0)
Protein, ur: NEGATIVE mg/dL
Specific Gravity, Urine: 1.021 (ref 1.005–1.030)
Urobilinogen, UA: 1 mg/dL (ref 0.0–1.0)

## 2014-01-04 LAB — CBC WITH DIFFERENTIAL/PLATELET
BASOS ABS: 0 10*3/uL (ref 0.0–0.1)
Basophils Relative: 0 % (ref 0–1)
EOS ABS: 0.1 10*3/uL (ref 0.0–0.7)
EOS PCT: 1 % (ref 0–5)
HEMATOCRIT: 40.4 % (ref 36.0–46.0)
Hemoglobin: 13.1 g/dL (ref 12.0–15.0)
LYMPHS PCT: 26 % (ref 12–46)
Lymphs Abs: 2.7 10*3/uL (ref 0.7–4.0)
MCH: 27.6 pg (ref 26.0–34.0)
MCHC: 32.4 g/dL (ref 30.0–36.0)
MCV: 85.2 fL (ref 78.0–100.0)
MONO ABS: 1 10*3/uL (ref 0.1–1.0)
Monocytes Relative: 9 % (ref 3–12)
Neutro Abs: 6.7 10*3/uL (ref 1.7–7.7)
Neutrophils Relative %: 63 % (ref 43–77)
Platelets: 251 10*3/uL (ref 150–400)
RBC: 4.74 MIL/uL (ref 3.87–5.11)
RDW: 14.9 % (ref 11.5–15.5)
WBC: 10.6 10*3/uL — AB (ref 4.0–10.5)

## 2014-01-04 LAB — URINE MICROSCOPIC-ADD ON

## 2014-01-04 LAB — BASIC METABOLIC PANEL
ANION GAP: 10 (ref 5–15)
BUN: 9 mg/dL (ref 6–23)
CALCIUM: 9.2 mg/dL (ref 8.4–10.5)
CO2: 28 meq/L (ref 19–32)
CREATININE: 0.8 mg/dL (ref 0.50–1.10)
Chloride: 102 mEq/L (ref 96–112)
GFR calc Af Amer: >90 mL/min (ref >90)
GFR calc non Af Amer: >90 mL/min (ref >90)
Glucose, Bld: 88 mg/dL (ref 70–99)
Potassium: 3.9 mEq/L (ref 3.7–5.3)
Sodium: 140 mEq/L (ref 137–147)

## 2014-01-04 LAB — PREGNANCY, URINE: Preg Test, Ur: NEGATIVE

## 2014-01-04 LAB — WET PREP, GENITAL
Trich, Wet Prep: NONE SEEN
Yeast Wet Prep HPF POC: NONE SEEN

## 2014-01-04 MED ORDER — METRONIDAZOLE 500 MG PO TABS: 500.0000 mg | ORAL_TABLET | Freq: Two times a day (BID) | ORAL | Status: DC

## 2014-01-04 MED ORDER — HYDROCODONE-ACETAMINOPHEN 5-325 MG PO TABS: 1.0000 | ORAL_TABLET | Freq: Four times a day (QID) | ORAL | Status: DC | PRN

## 2014-01-04 NOTE — ED Notes (Signed)
Pelvic cart set up and at pt bedside. She currently denies pain urine specimen obtained and sent

## 2014-01-04 NOTE — ED Notes (Signed)
Patient states that for 3 weeks she has had vaginal bleeding. States that she feels a "heavy pressure down there" as well.

## 2014-01-04 NOTE — Discharge Instructions (Signed)
Pelvic Pain °Female pelvic pain can be caused by many different things and start from a variety of places. Pelvic pain refers to pain that is located in the lower half of the abdomen and between your hips. The pain may occur over a short period of time (acute) or may be reoccurring (chronic). The cause of pelvic pain may be related to disorders affecting the female reproductive organs (gynecologic), but it may also be related to the bladder, kidney stones, an intestinal complication, or muscle or skeletal problems. Getting help right away for pelvic pain is important, especially if there has been severe, sharp, or a sudden onset of unusual pain. It is also important to get help right away because some types of pelvic pain can be life threatening.  °CAUSES  °Below are only some of the causes of pelvic pain. The causes of pelvic pain can be in one of several categories.  °· Gynecologic. °· Pelvic inflammatory disease. °· Sexually transmitted infection. °· Ovarian cyst or a twisted ovarian ligament (ovarian torsion). °· Uterine lining that grows outside the uterus (endometriosis). °· Fibroids, cysts, or tumors. °· Ovulation. °· Pregnancy. °· Pregnancy that occurs outside the uterus (ectopic pregnancy). °· Miscarriage. °· Labor. °· Abruption of the placenta or ruptured uterus. °· Infection. °· Uterine infection (endometritis). °· Bladder infection. °· Diverticulitis. °· Miscarriage related to a uterine infection (septic abortion). °· Bladder. °· Inflammation of the bladder (cystitis). °· Kidney stone(s). °· Gastrointestinal. °· Constipation. °· Diverticulitis. °· Neurologic. °· Trauma. °· Feeling pelvic pain because of mental or emotional causes (psychosomatic). °· Cancers of the bowel or pelvis. °EVALUATION  °Your caregiver will want to take a careful history of your concerns. This includes recent changes in your health, a careful gynecologic history of your periods (menses), and a sexual history. Obtaining your family  history and medical history is also important. Your caregiver may suggest a pelvic exam. A pelvic exam will help identify the location and severity of the pain. It also helps in the evaluation of which organ system may be involved. In order to identify the cause of the pelvic pain and be properly treated, your caregiver may order tests. These tests may include:  °· A pregnancy test. °· Pelvic ultrasonography. °· An X-ray exam of the abdomen. °· A urinalysis or evaluation of vaginal discharge. °· Blood tests. °HOME CARE INSTRUCTIONS  °· Only take over-the-counter or prescription medicines for pain, discomfort, or fever as directed by your caregiver.   °· Rest as directed by your caregiver.   °· Eat a balanced diet.   °· Drink enough fluids to make your urine clear or pale yellow, or as directed.   °· Avoid sexual intercourse if it causes pain.   °· Apply warm or cold compresses to the lower abdomen depending on which one helps the pain.   °· Avoid stressful situations.   °· Keep a journal of your pelvic pain. Write down when it started, where the pain is located, and if there are things that seem to be associated with the pain, such as food or your menstrual cycle. °· Follow up with your caregiver as directed.   °SEEK MEDICAL CARE IF: °· Your medicine does not help your pain. °· You have abnormal vaginal discharge. °SEEK IMMEDIATE MEDICAL CARE IF:  °· You have heavy bleeding from the vagina.   °· Your pelvic pain increases.   °· You feel light-headed or faint.   °· You have chills.   °· You have pain with urination or blood in your urine.   °· You have uncontrolled diarrhea   or vomiting.   °· You have a fever or persistent symptoms for more than 3 days. °· You have a fever and your symptoms suddenly get worse.   °· You are being physically or sexually abused.   °MAKE SURE YOU: °· Understand these instructions. °· Will watch your condition. °· Will get help if you are not doing well or get worse. °Document Released:  04/12/2004 Document Revised: 09/30/2013 Document Reviewed: 09/05/2011 °ExitCare® Patient Information ©2015 [employer]. This information is not intended to replace advice given to you by your health care provider. Make sure you discuss any questions you have with your health care provider. ° °Bacterial Vaginosis °Bacterial vaginosis is a vaginal infection that occurs when the normal balance of bacteria in the vagina is disrupted. It results from an overgrowth of certain bacteria. This is the most common vaginal infection in women of childbearing age. Treatment is important to prevent complications, especially in pregnant women, as it can cause a premature delivery. °CAUSES  °Bacterial vaginosis is caused by an increase in harmful bacteria that are normally present in smaller amounts in the vagina. Several different kinds of bacteria can cause bacterial vaginosis. However, the reason that the condition develops is not fully understood. °RISK FACTORS °Certain activities or behaviors can put you at an increased risk of developing bacterial vaginosis, including: °· Having a new sex partner or multiple sex partners. °· Douching. °· Using an intrauterine device (IUD) for contraception. °Women do not get bacterial vaginosis from toilet seats, bedding, swimming pools, or contact with objects around them. °SIGNS AND SYMPTOMS  °Some women with bacterial vaginosis have no signs or symptoms. Common symptoms include: °· Grey vaginal discharge. °· A fishlike odor with discharge, especially after sexual intercourse. °· Itching or burning of the vagina and vulva. °· Burning or pain with urination. °DIAGNOSIS  °Your health care provider will take a medical history and examine the vagina for signs of bacterial vaginosis. A sample of vaginal fluid may be taken. Your health care provider will look at this sample under a microscope to check for bacteria and abnormal cells. A vaginal pH test may also be done.  °TREATMENT  °Bacterial  vaginosis may be treated with antibiotic medicines. These may be given in the form of a pill or a vaginal cream. A second round of antibiotics may be prescribed if the condition comes back after treatment.  °HOME CARE INSTRUCTIONS  °· Only take over-the-counter or prescription medicines as directed by your health care provider. °· If antibiotic medicine was prescribed, take it as directed. Make sure you finish it even if you start to feel better. °· Do not have sex until treatment is completed. °· Tell all sexual partners that you have a vaginal infection. They should see their health care provider and be treated if they have problems, such as a mild rash or itching. °· Practice safe sex by using condoms and only having one sex partner. °SEEK MEDICAL CARE IF:  °· Your symptoms are not improving after 3 days of treatment. °· You have increased discharge or pain. °· You have a fever. °MAKE SURE YOU:  °· Understand these instructions. °· Will watch your condition. °· Will get help right away if you are not doing well or get worse. °FOR MORE INFORMATION  °Centers for Disease Control and Prevention, Division of STD Prevention: www.cdc.gov/std °American Sexual Health Association (ASHA): www.ashastd.org  °Document Released: 05/16/2005 Document Revised: 03/06/2013 Document Reviewed: 12/26/2012 °ExitCare® Patient Information ©2015 [employer]. This information is not intended to   replace advice given to you by your health care provider. Make sure you discuss any questions you have with your health care provider. ° °

## 2014-01-04 NOTE — ED Provider Notes (Signed)
CSN: [id number]     Arrival date & time 01/04/14  1750 History  This chart was scribed for Candace CookeyMegan Docherty, MD by Elon SpannerGarrett Cook, ED Scribe. This patient was seen in room MH12/MH12 and the patient's care was started at 7:31 PM.    Chief Complaint  Patient presents with  . Vaginal Bleeding    Patient is a 33 y.o. female presenting with vaginal bleeding. The history is provided by the patient. No language interpreter was used.  Vaginal Bleeding Quality:  Bright red and spotting Severity:  Moderate Onset quality:  Gradual Duration:  1 month Timing:  Intermittent Chronicity:  New Menstrual history:  Regular Relieved by:  Nothing Worsened by:  Nothing tried Ineffective treatments:  None tried Associated symptoms: no abdominal pain, no back pain, no dysuria, no fatigue, no fever and no nausea     HPI Comments: [name] is a 33 y.o. female with a history of endometriosis who presents to the Emergency Department complaining of 1 month of intermittent vaginal bleeding.  Patient states that she had a normal period 1 month ago and noticed spotting the following week.  Since then, the bleeding resolved and the patient had a normal period 6 days ago, but the intermittent bleeding began again after this most recent period. All of these episodes have been accompanied by an associated vaginal pain described as pressure and relieved by nothing.  She states that she attempted to control the bleeding 3 days ago by applying pressure with a moistened paper towel, but noticed only transient bleeding control.  The patient states that the bleeding has breached through her pad only once.  Patient also states she has had associated diarrhea within the last several days.  Patient denies fever, chills, recent yeast infections.  Patient states she has had two C-sections,    Past Medical History  Diagnosis Date  . Allergy-induced asthma   . Endometriosis   . Kidney stones   . Endometriosis    Past Surgical  History  Procedure Laterality Date  . Cesarean section      x2  . Cholecystectomy    . Kidney stone surgery    . Laser treatment for endometriosis  age 33  . Laparoscopy  age 33   No family history on file. History  Substance Use Topics  . Smoking status: Never Smoker   . Smokeless tobacco: Not on file  . Alcohol Use: No   OB History   Grav Para Term Preterm Abortions TAB SAB Ect Mult Living                 Review of Systems  Constitutional: Negative for fever, chills, diaphoresis, activity change, appetite change and fatigue.  HENT: Negative for congestion, facial swelling, rhinorrhea and sore throat.   Eyes: Negative for photophobia and discharge.  Respiratory: Negative for cough, chest tightness and shortness of breath.   Cardiovascular: Negative for chest pain, palpitations and leg swelling.  Gastrointestinal: Negative for nausea, vomiting, abdominal pain and diarrhea.  Endocrine: Negative for polydipsia and polyuria.  Genitourinary: Positive for vaginal bleeding. Negative for dysuria, frequency, difficulty urinating and pelvic pain.  Musculoskeletal: Negative for arthralgias, back pain, neck pain and neck stiffness.  Skin: Negative for color change and wound.  Allergic/Immunologic: Negative for immunocompromised state.  Neurological: Negative for facial asymmetry, weakness, numbness and headaches.  Hematological: Does not bruise/bleed easily.  Psychiatric/Behavioral: Negative for confusion and agitation.      Allergies  Sulfa antibiotics  Home Medications  Prior to Admission medications   Medication Sig Start Date End Date Taking? Authorizing Provider  EPINEPHrine (EPIPEN) 0.3 mg/0.3 mL DEVI Inject 0.3 mLs (0.3 mg total) into the muscle as needed. 09/13/12   April K Palumbo-Rasch, MD  fexofenadine-pseudoephedrine (ALLEGRA-D 24 HOUR) 180-240 MG per 24 hr tablet Take 1 tablet by mouth daily. 05/20/12   Rolan Bucco, MD  HYDROcodone-acetaminophen (NORCO/VICODIN)  5-325 MG per tablet Take 1 tablet by mouth every 6 (six) hours as needed for moderate pain or severe pain. 01/04/14   Candace Cookey, MD  methylPREDNISolone (MEDROL DOSEPAK) 4 MG tablet Day one: 2 tabs before breakfast, 1 tab after lunch, 1 tab after dinner, 2 tabs at bedtime Day 2: 1 tab before breakfast, 1 tab after lunch, 1 tab after dinner, 2 tabs at bedtime Day 3:  1 tab before breakfast, 1 tab after lunch,1 tab after dinner, 1 tab at bedtime Day 4: 1 tab before breakfast,1 tab after lunch, 1 tab at bedtime Day 5: 1 tab before breakfast, 1 tab at bedtime Day 6: 1 tab before breakfast 09/13/12   April K Palumbo-Rasch, MD  metroNIDAZOLE (FLAGYL) 500 MG tablet Take 1 tablet (500 mg total) by mouth 2 (two) times daily. One po bid x 7 days 01/04/14   Candace Cookey, MD  predniSONE (DELTASONE) 20 MG tablet Take 2 tablets (40 mg total) by mouth daily. Take 2 tabs po daily for 7 days and then 1 tab po daily for 7 days 09/11/12   Teressa Lower, NP   BP 100/79  Pulse 71  Temp(Src) 98.1 F (36.7 C) (Oral)  Resp 16  SpO2 100%  LMP 12/29/2013 Physical Exam  Nursing note and vitals reviewed. Constitutional: She is oriented to person, place, and time. She appears well-developed and well-nourished. No distress.  HENT:  Head: Normocephalic.  Mouth/Throat: Oropharynx is clear and moist.  Eyes: Pupils are equal, round, and reactive to light.  Neck: Neck supple.  Cardiovascular: Normal rate, regular rhythm and normal heart sounds.   Pulmonary/Chest: Effort normal and breath sounds normal. No respiratory distress. She has no wheezes.  Abdominal: Soft. She exhibits no distension. There is no tenderness. There is no rebound and no guarding.  Musculoskeletal: She exhibits no edema and no tenderness.  Neurological: She is alert and oriented to person, place, and time.  Skin: Skin is warm and dry.  Psychiatric: She has a normal mood and affect.    ED Course  Procedures (including critical care  time)  DIAGNOSTIC STUDIES: Oxygen Saturation is 100% on RA, normal by my interpretation.    COORDINATION OF CARE:  7:40 PM  Discussed plan to order labs.  Patient acknowledges and agrees with plan.    Labs Review Labs Reviewed  WET PREP, GENITAL - Abnormal; Notable for the following:    Clue Cells Wet Prep HPF POC FEW (*)    WBC, Wet Prep HPF POC FEW (*)    All other components within normal limits  CBC WITH DIFFERENTIAL - Abnormal; Notable for the following:    WBC 10.6 (*)    All other components within normal limits  URINALYSIS, ROUTINE W REFLEX MICROSCOPIC - Abnormal; Notable for the following:    APPearance CLOUDY (*)    Hgb urine dipstick LARGE (*)    Leukocytes, UA LARGE (*)    All other components within normal limits  URINE MICROSCOPIC-ADD ON - Abnormal; Notable for the following:    Squamous Epithelial / LPF MANY (*)    Bacteria, UA MANY (*)  All other components within normal limits  URINE CULTURE  GC/CHLAMYDIA PROBE AMP  PREGNANCY, URINE  BASIC METABOLIC PANEL  HIV ANTIBODY (ROUTINE TESTING)    Imaging Review No results found.   EKG Interpretation None      MDM   Final diagnoses:  Vaginal pain  BV (bacterial vaginosis)    Pt is a 33 y.o. female with Pmhx as above who presents with vaginal pressure and spotting. On PE, VSS, pt in NAD. She has slight laxity of anterior vaginal wall. Pelvic otherwise w/ scant dark brown menstrual appearing blood from os. Wet prep with clue cells. Will treat for BV, though suspect symptoms likely due to vaginal wall weakness. No definitive rectocele, cystocele and uterine prolapse on PE. Will refer to GYN.       I personally performed the services described in this documentation, which was scribed in my presence. The recorded information has been reviewed and is accurate.     Candace Cookey, MD 01/07/14 1258

## 2014-01-05 LAB — HIV ANTIBODY (ROUTINE TESTING W REFLEX): HIV: NONREACTIVE

## 2014-01-07 LAB — URINE CULTURE: Colony Count: >=100000

## 2014-01-07 LAB — GC/CHLAMYDIA PROBE AMP
CT Probe RNA: NEGATIVE
GC Probe RNA: NEGATIVE

## 2014-01-08 ENCOUNTER — Telehealth (HOSPITAL_BASED_OUTPATIENT_CLINIC_OR_DEPARTMENT_OTHER): Payer: Self-pay | Admitting: Emergency Medicine

## 2014-01-08 NOTE — Progress Notes (Signed)
ED Antimicrobial Stewardship Positive Culture Follow Up   [name] is an 33 y.o. female [facility] with a chief complaint of  Chief Complaint  Patient presents with  . Vaginal Bleeding    Recent Results (from the past 720 hour(s))  URINE CULTURE     Status: None   Collection Time    01/04/14  7:13 PM      Result Value Ref Range Status   Specimen Description URINE, CLEAN CATCH   Final   Special Requests NONE   Final   Culture  Setup Time     Final   Value: 01/05/2014 03:43     Performed at [facility]   Colony Count     Final   Value: >=100,000 COLONIES/ML     Performed at [facility]   Culture     Final   Value: ESCHERICHIA COLI     Performed at [facility]   Report Status 01/07/2014 FINAL   Final   Organism ID, Bacteria ESCHERICHIA COLI   Final  GC/CHLAMYDIA PROBE AMP     Status: None   Collection Time    01/04/14  7:59 PM      Result Value Ref Range Status   CT Probe RNA NEGATIVE  NEGATIVE Final   GC Probe RNA NEGATIVE  NEGATIVE Final   Comment: (NOTE)                                                                                               **Normal Reference Range: Negative**          Assay performed using the Gen-Probe APTIMA COMBO2 (R) Assay.     Acceptable specimen types for this assay include APTIMA Swabs (Unisex,     endocervical, urethral, or vaginal), first void urine, and ThinPrep     liquid based cytology samples.     Performed at [facility]  WET PREP, GENITAL     Status: Abnormal   Collection Time    01/04/14  7:59 PM      Result Value Ref Range Status   Yeast Wet Prep HPF POC NONE SEEN  NONE SEEN Final   Trich, Wet Prep NONE SEEN  NONE SEEN Final   Clue Cells Wet Prep HPF POC FEW (*) NONE SEEN Final   WBC, Wet Prep HPF POC FEW (*) NONE SEEN Final    Patient with asymptomatic UTI sent home on metronidazole 500 mg PO BID x 7 days for bacterial vaginitis.  Patient's UA had  multiple epithelial cells, so likely not a clean catch. Will follow up with patient and make sure she took her metronidazole and followed up with her OBGYN.  ED Provider: Jaynie Crumbleatyana Kirichenko, PA-C  Newton PiggStewart, Cassie L 01/08/2014, 2:11 PM Infectious Diseases Pharmacist Phone# [phone]

## 2014-01-10 ENCOUNTER — Telehealth (HOSPITAL_BASED_OUTPATIENT_CLINIC_OR_DEPARTMENT_OTHER): Payer: Self-pay | Admitting: Emergency Medicine

## 2014-05-17 ENCOUNTER — Emergency Department (HOSPITAL_BASED_OUTPATIENT_CLINIC_OR_DEPARTMENT_OTHER)
Admission: EM | Admit: 2014-05-17 | Discharge: 2014-05-17 | Disposition: A | Discharge status: Home / Self Care | Payer: Self-pay | Attending: Emergency Medicine | Admitting: Emergency Medicine

## 2014-05-17 ENCOUNTER — Encounter (HOSPITAL_BASED_OUTPATIENT_CLINIC_OR_DEPARTMENT_OTHER): Payer: Self-pay | Admitting: *Deleted

## 2014-05-17 ENCOUNTER — Emergency Department (HOSPITAL_BASED_OUTPATIENT_CLINIC_OR_DEPARTMENT_OTHER): Payer: Self-pay

## 2014-05-17 DIAGNOSIS — R059 Cough, unspecified: Secondary | ICD-10-CM

## 2014-05-17 DIAGNOSIS — R05 Cough: Secondary | ICD-10-CM

## 2014-05-17 DIAGNOSIS — Z87442 Personal history of urinary calculi: Secondary | ICD-10-CM | POA: Insufficient documentation

## 2014-05-17 DIAGNOSIS — Z79899 Other long term (current) drug therapy: Secondary | ICD-10-CM | POA: Insufficient documentation

## 2014-05-17 DIAGNOSIS — J069 Acute upper respiratory infection, unspecified: Secondary | ICD-10-CM | POA: Insufficient documentation

## 2014-05-17 DIAGNOSIS — E669 Obesity, unspecified: Secondary | ICD-10-CM | POA: Insufficient documentation

## 2014-05-17 DIAGNOSIS — Z8742 Personal history of other diseases of the female genital tract: Secondary | ICD-10-CM | POA: Insufficient documentation

## 2014-05-17 DIAGNOSIS — J45909 Unspecified asthma, uncomplicated: Secondary | ICD-10-CM | POA: Insufficient documentation

## 2014-05-17 MED ORDER — GUAIFENESIN-CODEINE 100-10 MG/5ML PO SYRP: 5.0000 mL | ORAL_SOLUTION | Freq: Three times a day (TID) | ORAL | Status: DC | PRN

## 2014-05-17 NOTE — Discharge Instructions (Signed)
Upper Respiratory Infection, Adult An upper respiratory infection (URI) is also sometimes known as the common cold. The upper respiratory tract includes the nose, sinuses, throat, trachea, and bronchi. Bronchi are the airways leading to the lungs. Most people improve within 1 week, but symptoms can last up to 2 weeks. A residual cough may last even longer.  CAUSES Many different viruses can infect the tissues lining the upper respiratory tract. The tissues become irritated and inflamed and often become very moist. Mucus production is also common. A cold is contagious. You can easily spread the virus to others by oral contact. This includes kissing, sharing a glass, coughing, or sneezing. Touching your mouth or nose and then touching a surface, which is then touched by another person, can also spread the virus. SYMPTOMS  Symptoms typically develop 1 to 3 days after you come in contact with a cold virus. Symptoms vary from person to person. They may include:  Runny nose.  Sneezing.  Nasal congestion.  Sinus irritation.  Sore throat.  Loss of voice (laryngitis).  Cough.  Fatigue.  Muscle aches.  Loss of appetite.  Headache.  Low-grade fever. DIAGNOSIS  You might diagnose your own cold based on familiar symptoms, since most people get a cold 2 to 3 times a year. Your caregiver can confirm this based on your exam. Most importantly, your caregiver can check that your symptoms are not due to another disease such as strep throat, sinusitis, pneumonia, asthma, or epiglottitis. Blood tests, throat tests, and X-rays are not necessary to diagnose a common cold, but they may sometimes be helpful in excluding other more serious diseases. Your caregiver will decide if any further tests are required. RISKS AND COMPLICATIONS  You may be at risk for a more severe case of the common cold if you smoke cigarettes, have chronic heart disease (such as heart failure) or lung disease (such as asthma), or if  you have a weakened immune system. The very young and very old are also at risk for more serious infections. Bacterial sinusitis, middle ear infections, and bacterial pneumonia can complicate the common cold. The common cold can worsen asthma and chronic obstructive pulmonary disease (COPD). Sometimes, these complications can require emergency medical care and may be life-threatening. PREVENTION  The best way to protect against getting a cold is to practice good hygiene. Avoid oral or hand contact with people with cold symptoms. Wash your hands often if contact occurs. There is no clear evidence that vitamin C, vitamin E, echinacea, or exercise reduces the chance of developing a cold. However, it is always recommended to get plenty of rest and practice good nutrition. TREATMENT  Treatment is directed at relieving symptoms. There is no cure. Antibiotics are not effective, because the infection is caused by a virus, not by bacteria. Treatment may include:  Increased fluid intake. Sports drinks offer valuable electrolytes, sugars, and fluids.  Breathing heated mist or steam (vaporizer or shower).  Eating chicken soup or other clear broths, and maintaining good nutrition.  Getting plenty of rest.  Using gargles or lozenges for comfort.  Controlling fevers with ibuprofen or acetaminophen as directed by your caregiver.  Increasing usage of your inhaler if you have asthma. Zinc gel and zinc lozenges, taken in the first 24 hours of the common cold, can shorten the duration and lessen the severity of symptoms. Pain medicines may help with fever, muscle aches, and throat pain. A variety of non-prescription medicines are available to treat congestion and runny nose. Your caregiver   can make recommendations and may suggest nasal or lung inhalers for other symptoms.  HOME CARE INSTRUCTIONS   Only take over-the-counter or prescription medicines for pain, discomfort, or fever as directed by your  caregiver.  Use a warm mist humidifier or inhale steam from a shower to increase air moisture. This may keep secretions moist and make it easier to breathe.  Drink enough water and fluids to keep your urine clear or pale yellow.  Rest as needed.  Return to work when your temperature has returned to normal or as your caregiver advises. You may need to stay home longer to avoid infecting others. You can also use a face mask and careful hand washing to prevent spread of the virus. SEEK MEDICAL CARE IF:   After the first few days, you feel you are getting worse rather than better.  You need your caregiver's advice about medicines to control symptoms.  You develop chills, worsening shortness of breath, or brown or red sputum. These may be signs of pneumonia.  You develop yellow or brown nasal discharge or pain in the face, especially when you bend forward. These may be signs of sinusitis.  You develop a fever, swollen neck glands, pain with swallowing, or white areas in the back of your throat. These may be signs of strep throat. SEEK IMMEDIATE MEDICAL CARE IF:   You have a fever.  You develop severe or persistent headache, ear pain, sinus pain, or chest pain.  You develop wheezing, a prolonged cough, cough up blood, or have a change in your usual mucus (if you have chronic lung disease).  You develop sore muscles or a stiff neck. Document Released: 11/09/2000 Document Revised: 08/08/2011 Document Reviewed: 08/21/2013 ExitCare Patient Information 2015 [employer]. This information is not intended to replace advice given to you by your health care provider. Make sure you discuss any questions you have with your health care provider.  

## 2014-05-17 NOTE — ED Provider Notes (Signed)
CSN: [id number]     Arrival date & time 05/17/14  1040 History   First MD Initiated Contact with Patient 05/17/14 1202     Chief Complaint  Patient presents with  . URI     (Consider location/radiation/quality/duration/timing/severity/associated sxs/prior Treatment) HPI   33 year old female with history of allergy-induced asthma presents for evaluation of cough. Patient states for the past week she has had a nonproductive cough. Furthermore she also endorsed having some congestion, sneeze, sore throat, subjective fever. She has been using over-the-counter medication including Delsym and Mucinex with minimal relief. She denies any medication changes. Denies any severe headache, trouble swallowing, shortness of breath, chest pain, nausea vomiting diarrhea, abdominal pain or rash. She is a nonsmoker.  Past Medical History  Diagnosis Date  . Allergy-induced asthma   . Endometriosis   . Kidney stones   . Endometriosis    Past Surgical History  Procedure Laterality Date  . Cesarean section      x2  . Cholecystectomy    . Kidney stone surgery    . Laser treatment for endometriosis  age 89  . Laparoscopy  age 33   History reviewed. No pertinent family history. History  Substance Use Topics  . Smoking status: Never Smoker   . Smokeless tobacco: Not on file  . Alcohol Use: No   OB History    No data available     Review of Systems  Constitutional: Negative for fever.  Respiratory: Positive for cough.   Skin: Negative for rash.      Allergies  Sulfa antibiotics  Home Medications   Prior to Admission medications   Medication Sig Start Date End Date Taking? Authorizing Provider  fexofenadine-pseudoephedrine (ALLEGRA-D 24 HOUR) 180-240 MG per 24 hr tablet Take 1 tablet by mouth daily. 05/20/12   Rolan BuccoMelanie Belfi, MD   BP 132/46 mmHg  Pulse 89  Temp(Src) 98.1 F (36.7 C) (Oral)  Resp 18  SpO2 100%  LMP 05/10/2014 Physical Exam  Constitutional: She is oriented to  person, place, and time. She appears well-developed and well-nourished. No distress.  Moderately obese Caucasian female appears to be in no acute distress and nontoxic in appearance.  HENT:  Head: Atraumatic.  Right Ear: External ear normal.  Left Ear: External ear normal.  Nose: Nose normal.  Mouth/Throat: Oropharynx is clear and moist. No oropharyngeal exudate.  Eyes: Conjunctivae are normal.  Neck: Normal range of motion. Neck supple.  No nuchal rigidity  Cardiovascular: Normal rate and regular rhythm.   Pulmonary/Chest: Effort normal and breath sounds normal. She has no wheezes.  Abdominal: Soft. There is no tenderness.  Neurological: She is alert and oriented to person, place, and time.  Skin: No rash noted.  Psychiatric: She has a normal mood and affect.  Nursing note and vitals reviewed.   ED Course  Procedures (including critical care time)  Patient with URI symptoms. She is afebrile, no hypoxia, chest x-ray without acute infiltrate concerning for pneumonia. Plan to provide patient with cough medication and care instructions. Return precautions discussed.  Labs Review Labs Reviewed - No data to display  Imaging Review Dg Chest 2 View  05/17/2014   CLINICAL DATA:  Cold, mid chest pain, shortness of breath and coughing  EXAM: CHEST  2 VIEW  COMPARISON:  None.  FINDINGS: The heart size and mediastinal contours are within normal limits. Both lungs are clear. The visualized skeletal structures are unremarkable.  IMPRESSION: No active cardiopulmonary disease.   Electronically Signed   By: Alan RipperHetal  Patel   On: 05/17/2014 11:29     EKG Interpretation None      MDM   Final diagnoses:  URI (upper respiratory infection)    BP 132/46 mmHg  Pulse 89  Temp(Src) 98.1 F (36.7 C) (Oral)  Resp 18  SpO2 100%  LMP 05/10/2014  I have reviewed nursing notes and vital signs. I personally reviewed the imaging tests through PACS system  I reviewed available ER/hospitalization  records thought the EMR     Fayrene HelperBowie Clearence Vitug, PA-C 05/17/14 1214  Hilario Quarryanielle S Ray, MD 05/17/14 [phone]

## 2014-05-17 NOTE — ED Notes (Signed)
Pt reports cough and runny nose x 1 week- denies known fever, body aches, chills

## 2014-05-17 NOTE — ED Notes (Signed)
Productive cough x1 week. Denies fever

## 2014-05-17 NOTE — ED Notes (Signed)
Rx x 1 for robitussin AC given to pt

## 2014-07-10 ENCOUNTER — Emergency Department (HOSPITAL_COMMUNITY)
Admission: EM | Admit: 2014-07-10 | Discharge: 2014-07-10 | Disposition: A | Discharge status: Home / Self Care | Payer: BLUE CROSS/BLUE SHIELD | Attending: Emergency Medicine | Admitting: Emergency Medicine

## 2014-07-10 ENCOUNTER — Encounter (HOSPITAL_COMMUNITY): Payer: Self-pay | Admitting: Emergency Medicine

## 2014-07-10 ENCOUNTER — Emergency Department (HOSPITAL_COMMUNITY): Payer: BLUE CROSS/BLUE SHIELD

## 2014-07-10 DIAGNOSIS — Z87442 Personal history of urinary calculi: Secondary | ICD-10-CM | POA: Diagnosis not present

## 2014-07-10 DIAGNOSIS — Z792 Long term (current) use of antibiotics: Secondary | ICD-10-CM | POA: Diagnosis not present

## 2014-07-10 DIAGNOSIS — N8320 Unspecified ovarian cysts: Secondary | ICD-10-CM | POA: Insufficient documentation

## 2014-07-10 DIAGNOSIS — R109 Unspecified abdominal pain: Secondary | ICD-10-CM | POA: Diagnosis present

## 2014-07-10 DIAGNOSIS — J45909 Unspecified asthma, uncomplicated: Secondary | ICD-10-CM | POA: Diagnosis not present

## 2014-07-10 DIAGNOSIS — Z3202 Encounter for pregnancy test, result negative: Secondary | ICD-10-CM | POA: Insufficient documentation

## 2014-07-10 DIAGNOSIS — N83202 Unspecified ovarian cyst, left side: Secondary | ICD-10-CM

## 2014-07-10 DIAGNOSIS — Z91040 Latex allergy status: Secondary | ICD-10-CM | POA: Insufficient documentation

## 2014-07-10 LAB — COMPREHENSIVE METABOLIC PANEL
ALK PHOS: 66 U/L (ref 39–117)
ALT: 16 U/L (ref 0–35)
ANION GAP: 6 (ref 5–15)
AST: 19 U/L (ref 0–37)
Albumin: 3.6 g/dL (ref 3.5–5.2)
BILIRUBIN TOTAL: 0.7 mg/dL (ref 0.3–1.2)
BUN: 15 mg/dL (ref 6–23)
CHLORIDE: 107 mmol/L (ref 96–112)
CO2: 24 mmol/L (ref 19–32)
Calcium: 8.4 mg/dL (ref 8.4–10.5)
Creatinine, Ser: 0.79 mg/dL (ref 0.50–1.10)
GFR calc non Af Amer: >90 mL/min (ref >90)
Glucose, Bld: 86 mg/dL (ref 70–99)
POTASSIUM: 4.3 mmol/L (ref 3.5–5.1)
SODIUM: 137 mmol/L (ref 135–145)
TOTAL PROTEIN: 7.3 g/dL (ref 6.0–8.3)

## 2014-07-10 LAB — CBC WITH DIFFERENTIAL/PLATELET
BASOS ABS: 0 10*3/uL (ref 0.0–0.1)
BASOS PCT: 0 % (ref 0–1)
EOS ABS: 0.1 10*3/uL (ref 0.0–0.7)
Eosinophils Relative: 1 % (ref 0–5)
HCT: 39 % (ref 36.0–46.0)
Hemoglobin: 12.4 g/dL (ref 12.0–15.0)
LYMPHS ABS: 2.9 10*3/uL (ref 0.7–4.0)
Lymphocytes Relative: 30 % (ref 12–46)
MCH: 27.4 pg (ref 26.0–34.0)
MCHC: 31.8 g/dL (ref 30.0–36.0)
MCV: 86.1 fL (ref 78.0–100.0)
Monocytes Absolute: 0.9 10*3/uL (ref 0.1–1.0)
Monocytes Relative: 9 % (ref 3–12)
Neutro Abs: 5.9 10*3/uL (ref 1.7–7.7)
Neutrophils Relative %: 60 % (ref 43–77)
PLATELETS: 260 10*3/uL (ref 150–400)
RBC: 4.53 MIL/uL (ref 3.87–5.11)
RDW: 14.5 % (ref 11.5–15.5)
WBC: 9.8 10*3/uL (ref 4.0–10.5)

## 2014-07-10 LAB — URINALYSIS, ROUTINE W REFLEX MICROSCOPIC
Bilirubin Urine: NEGATIVE
Glucose, UA: NEGATIVE mg/dL
HGB URINE DIPSTICK: NEGATIVE
KETONES UR: NEGATIVE mg/dL
Leukocytes, UA: NEGATIVE
Nitrite: NEGATIVE
Protein, ur: NEGATIVE mg/dL
Specific Gravity, Urine: 1.023 (ref 1.005–1.030)
UROBILINOGEN UA: 1 mg/dL (ref 0.0–1.0)
pH: 7 (ref 5.0–8.0)

## 2014-07-10 LAB — LIPASE, BLOOD: LIPASE: 20 U/L (ref 11–59)

## 2014-07-10 LAB — POC URINE PREG, ED: PREG TEST UR: NEGATIVE

## 2014-07-10 MED ORDER — HYDROCODONE-ACETAMINOPHEN 5-325 MG PO TABS: 1.0000 | ORAL_TABLET | Freq: Four times a day (QID) | ORAL | Status: DC | PRN

## 2014-07-10 NOTE — ED Notes (Signed)
Unable to draw extra labs, only what was ordered

## 2014-07-10 NOTE — Discharge Instructions (Signed)
Ovarian Cyst An ovarian cyst is a fluid-filled sac that forms on an ovary. The ovaries are small organs that produce eggs in women. Various types of cysts can form on the ovaries. Most are not cancerous. Many do not cause problems, and they often go away on their own. Some may cause symptoms and require treatment. Common types of ovarian cysts include:  Functional cysts--These cysts may occur every month during the menstrual cycle. This is normal. The cysts usually go away with the next menstrual cycle if the woman does not get pregnant. Usually, there are no symptoms with a functional cyst.  Endometrioma cysts--These cysts form from the tissue that lines the uterus. They are also called "chocolate cysts" because they become filled with blood that turns brown. This type of cyst can cause pain in the lower abdomen during intercourse and with your menstrual period.  Cystadenoma cysts--This type develops from the cells on the outside of the ovary. These cysts can get very big and cause lower abdomen pain and pain with intercourse. This type of cyst can twist on itself, cut off its blood supply, and cause severe pain. It can also easily rupture and cause a lot of pain.  Dermoid cysts--This type of cyst is sometimes found in both ovaries. These cysts may contain different kinds of body tissue, such as skin, teeth, hair, or cartilage. They usually do not cause symptoms unless they get very big.  Theca lutein cysts--These cysts occur when too much of a certain hormone (human chorionic gonadotropin) is produced and overstimulates the ovaries to produce an egg. This is most common after procedures used to assist with the conception of a baby (in vitro fertilization). CAUSES   Fertility drugs can cause a condition in which multiple large cysts are formed on the ovaries. This is called ovarian hyperstimulation syndrome.  A condition called polycystic ovary syndrome can cause hormonal imbalances that can lead to  nonfunctional ovarian cysts. SIGNS AND SYMPTOMS  Many ovarian cysts do not cause symptoms. If symptoms are present, they may include:  Pelvic pain or pressure.  Pain in the lower abdomen.  Pain during sexual intercourse.  Increasing girth (swelling) of the abdomen.  Abnormal menstrual periods.  Increasing pain with menstrual periods.  Stopping having menstrual periods without being pregnant. DIAGNOSIS  These cysts are commonly found during a routine or annual pelvic exam. Tests may be ordered to find out more about the cyst. These tests may include:  Ultrasound.  X-ray of the pelvis.  CT scan.  MRI.  Blood tests. TREATMENT  Many ovarian cysts go away on their own without treatment. Your health care provider may want to check your cyst regularly for 2-3 months to see if it changes. For women in menopause, it is particularly important to monitor a cyst closely because of the higher rate of ovarian cancer in menopausal women. When treatment is needed, it may include any of the following:  A procedure to drain the cyst (aspiration). This may be done using a long needle and ultrasound. It can also be done through a laparoscopic procedure. This involves using a thin, lighted tube with a tiny camera on the end (laparoscope) inserted through a small incision.  Surgery to remove the whole cyst. This may be done using laparoscopic surgery or an open surgery involving a larger incision in the lower abdomen.  Hormone treatment or birth control pills. These methods are sometimes used to help dissolve a cyst. HOME CARE INSTRUCTIONS   Only take over-the-counter   or prescription medicines as directed by your health care provider.  Follow up with your health care provider as directed.  Get regular pelvic exams and Pap tests. SEEK MEDICAL CARE IF:   Your periods are late, irregular, or painful, or they stop.  Your pelvic pain or abdominal pain does not go away.  Your abdomen becomes  larger or swollen.  You have pressure on your bladder or trouble emptying your bladder completely.  You have pain during sexual intercourse.  You have feelings of fullness, pressure, or discomfort in your stomach.  You lose weight for no apparent reason.  You feel generally ill.  You become constipated.  You lose your appetite.  You develop acne.  You have an increase in body and facial hair.  You are gaining weight, without changing your exercise and eating habits.  You think you are pregnant. SEEK IMMEDIATE MEDICAL CARE IF:   You have increasing abdominal pain.  You feel sick to your stomach (nauseous), and you throw up (vomit).  You develop a fever that comes on suddenly.  You have abdominal pain during a bowel movement.  Your menstrual periods become heavier than usual. MAKE SURE YOU:  Understand these instructions.  Will watch your condition.  Will get help right away if you are not doing well or get worse. Document Released: 05/16/2005 Document Revised: 05/21/2013 Document Reviewed: 01/21/2013 Exit[employer] Patient Information 2015 [employer]. This information is not intended to replace advice given to you by your health care provider. Make sure you discuss any questions you have with your health care provider.  

## 2014-07-10 NOTE — ED Provider Notes (Signed)
CSN: [id number]     Arrival date & time 07/10/14  1248 History   First MD Initiated Contact with Patient 07/10/14 1410     Chief Complaint  Patient presents with  . Abdominal Pain     (Consider location/radiation/quality/duration/timing/severity/associated sxs/prior Treatment) HPI Comments: Patient presents emergency department with chief complaint of LEFT flank pain (contrary to nursing note).  She states pain started yesterday. She was seen at urgent care last night's, and was referred to her primary care doctor today for a CAT scan. She was unable to have the CT scan performed. She was told that she may have a kidney stone versus diverticulitis versus ovarian cyst. She comes to the ED for a CT scan. She states that the pain is moderate to severe. It is in her left flank and radiates to her left back. It is not worsened with palpation or movement. She has not tried taking anything to alleviate her symptoms. She denies any fevers, chills, nausea, vomiting, diarrhea, constipation. She reports having a history of kidney stones.  The history is provided by the patient. No language interpreter was used.    Past Medical History  Diagnosis Date  . Allergy-induced asthma   . Endometriosis   . Kidney stones   . Endometriosis    Past Surgical History  Procedure Laterality Date  . Cesarean section      x2  . Cholecystectomy    . Kidney stone surgery    . Laser treatment for endometriosis  age 54  . Laparoscopy  age 50   History reviewed. No pertinent family history. History  Substance Use Topics  . Smoking status: Never Smoker   . Smokeless tobacco: Not on file  . Alcohol Use: No   OB History    No data available     Review of Systems  Constitutional: Negative for fever and chills.  Respiratory: Negative for shortness of breath.   Cardiovascular: Negative for chest pain.  Gastrointestinal: Negative for nausea, vomiting, diarrhea and constipation.  Genitourinary: Positive for  flank pain. Negative for dysuria.  All other systems reviewed and are negative.     Allergies  Latex and Sulfa antibiotics  Home Medications   Prior to Admission medications   Medication Sig Start Date End Date Taking? Authorizing Provider  ciprofloxacin (CIPRO) 500 MG tablet Take 500 mg by mouth 2 (two) times daily.   Yes Historical Provider, MD  metroNIDAZOLE (FLAGYL) 500 MG tablet Take 500 mg by mouth 2 (two) times daily.   Yes Historical Provider, MD  fexofenadine-pseudoephedrine (ALLEGRA-D 24 HOUR) 180-240 MG per 24 hr tablet Take 1 tablet by mouth daily. Patient not taking: Reported on 07/10/2014 05/20/12   Rolan Bucco, MD  guaiFENesin-codeine [facility]) 100-10 MG/5ML syrup Take 5 mLs by mouth 3 (three) times daily as needed for cough. Patient not taking: Reported on 07/10/2014 05/17/14   Fayrene Helper, PA-C   BP 130/101 mmHg  Pulse 72  Temp(Src) 98.3 F (36.8 C) (Oral)  Resp 17  SpO2 100%  LMP 06/28/2014 Physical Exam  Constitutional: She is oriented to person, place, and time. She appears well-developed and well-nourished.  HENT:  Head: Normocephalic and atraumatic.  Eyes: Conjunctivae and EOM are normal. Pupils are equal, round, and reactive to light.  Neck: Normal range of motion. Neck supple.  Cardiovascular: Normal rate and regular rhythm.  Exam reveals no gallop and no friction rub.   No murmur heard. Pulmonary/Chest: Effort normal and breath sounds normal. No respiratory distress. She has no  wheezes. She has no rales. She exhibits no tenderness.  Abdominal: Soft. Bowel sounds are normal. She exhibits no distension and no mass. There is no tenderness. There is no rebound and no guarding.  Moderate left-sided flank pain, no focal abdominal tenderness  Musculoskeletal: Normal range of motion. She exhibits no edema or tenderness.  Neurological: She is alert and oriented to person, place, and time.  Skin: Skin is warm and dry.  Psychiatric: She has a normal mood  and affect. Her behavior is normal. Judgment and thought content normal.  Nursing note and vitals reviewed.   ED Course  Procedures (including critical care time) Results for orders placed or performed during the hospital encounter of 07/10/14  CBC with Differential  Result Value Ref Range   WBC 9.8 4.0 - 10.5 K/uL   RBC 4.53 3.87 - 5.11 MIL/uL   Hemoglobin 12.4 12.0 - 15.0 g/dL   HCT 16.1 09.6 - 04.5 %   MCV 86.1 78.0 - 100.0 fL   MCH 27.4 26.0 - 34.0 pg   MCHC 31.8 30.0 - 36.0 g/dL   RDW 40.9 81.1 - 91.4 %   Platelets 260 150 - 400 K/uL   Neutrophils Relative % 60 43 - 77 %   Neutro Abs 5.9 1.7 - 7.7 K/uL   Lymphocytes Relative 30 12 - 46 %   Lymphs Abs 2.9 0.7 - 4.0 K/uL   Monocytes Relative 9 3 - 12 %   Monocytes Absolute 0.9 0.1 - 1.0 K/uL   Eosinophils Relative 1 0 - 5 %   Eosinophils Absolute 0.1 0.0 - 0.7 K/uL   Basophils Relative 0 0 - 1 %   Basophils Absolute 0.0 0.0 - 0.1 K/uL  Comprehensive metabolic panel  Result Value Ref Range   Sodium 137 135 - 145 mmol/L   Potassium 4.3 3.5 - 5.1 mmol/L   Chloride 107 96 - 112 mmol/L   CO2 24 19 - 32 mmol/L   Glucose, Bld 86 70 - 99 mg/dL   BUN 15 6 - 23 mg/dL   Creatinine, Ser 7.82 0.50 - 1.10 mg/dL   Calcium 8.4 8.4 - 95.6 mg/dL   Total Protein 7.3 6.0 - 8.3 g/dL   Albumin 3.6 3.5 - 5.2 g/dL   AST 19 0 - 37 U/L   ALT 16 0 - 35 U/L   Alkaline Phosphatase 66 39 - 117 U/L   Total Bilirubin 0.7 0.3 - 1.2 mg/dL   GFR calc non Af Amer >90 >90 mL/min   GFR calc Af Amer >90 >90 mL/min   Anion gap 6 5 - 15  Lipase, blood  Result Value Ref Range   Lipase 20 11 - 59 U/L  Urinalysis, Routine w reflex microscopic  Result Value Ref Range   Color, Urine YELLOW YELLOW   APPearance CLEAR CLEAR   Specific Gravity, Urine 1.023 1.005 - 1.030   pH 7.0 5.0 - 8.0   Glucose, UA NEGATIVE NEGATIVE mg/dL   Hgb urine dipstick NEGATIVE NEGATIVE   Bilirubin Urine NEGATIVE NEGATIVE   Ketones, ur NEGATIVE NEGATIVE mg/dL   Protein, ur  NEGATIVE NEGATIVE mg/dL   Urobilinogen, UA 1.0 0.0 - 1.0 mg/dL   Nitrite NEGATIVE NEGATIVE   Leukocytes, UA NEGATIVE NEGATIVE  POC Urine Pregnancy, ED  (If Pre-menopausal female) - do not order at [facility]  Result Value Ref Range   Preg Test, Ur NEGATIVE NEGATIVE   Ct Abdomen Pelvis Wo Contrast  07/10/2014   CLINICAL DATA:  Left lateral abdominal pain  since 07/09/2014. History of stones.  EXAM: CT ABDOMEN AND PELVIS WITHOUT CONTRAST  TECHNIQUE: Multidetector CT imaging of the abdomen and pelvis was performed following the standard protocol without IV contrast.  COMPARISON:  None.  FINDINGS: BODY WALL: Unremarkable.  LOWER CHEST: Unremarkable.  ABDOMEN/PELVIS:  Liver: No focal abnormality.  Biliary: Cholecystectomy.  No ductal enlargement  Pancreas: Unremarkable.  Spleen: Unremarkable.  Adrenals: Unremarkable.  Kidneys and ureters: No hydronephrosis or stone.  Bladder: Unremarkable.  Reproductive: 2 cysts in the left ovary, up to 2.5 cm. The more cranial and rounded is slightly denser than the other, although blood clot density is not obtained.  Bowel: No obstruction. Negative appendix.  Retroperitoneum: No mass or adenopathy.  Peritoneum: No ascites or pneumoperitoneum.  Vascular: Duplicated IVC.  OSSEOUS: No acute abnormalities.  IMPRESSION: 1. No urolithiasis or hydronephrosis. 2. Two left ovarian cysts.  The larger, 2.5 cm, may be hemorrhagic.   Electronically Signed   By: Marnee SpringJonathon  Watts M.D.   On: 07/10/2014 15:03     Imaging Review No results found.   EKG Interpretation None      MDM   Final diagnoses:  Flank pain  Cyst of left ovary    Patient with left flank pain. No diarrhea, no fever. Will check CT scan for kidney stone.  CT is negative for kidney stone, but does show a left-sided ovarian cyst. Recommend OB/GYN follow-up. Will discharge home with pain medicine. Vital signs are stable. She is well-appearing.    Roxy HorsemanRobert Zeba Luby, PA-C 07/10/14 1535  Vanetta MuldersScott Zackowski,  MD 07/11/14 1042

## 2014-07-10 NOTE — ED Notes (Signed)
Pt c/o RLQ pain worsening throughout the day, urgent care last night, went to PCP for CT scan, scan could not be done today so patient was sent to ED. Denies emesis, denies diarrhea.

## 2014-09-04 ENCOUNTER — Other Ambulatory Visit: Payer: Self-pay | Admitting: Obstetrics and Gynecology

## 2014-09-05 LAB — CYTOLOGY - PAP

## 2015-06-08 ENCOUNTER — Emergency Department (HOSPITAL_BASED_OUTPATIENT_CLINIC_OR_DEPARTMENT_OTHER): Payer: BLUE CROSS/BLUE SHIELD

## 2015-06-08 ENCOUNTER — Emergency Department (HOSPITAL_BASED_OUTPATIENT_CLINIC_OR_DEPARTMENT_OTHER)
Admission: EM | Admit: 2015-06-08 | Discharge: 2015-06-08 | Disposition: A | Discharge status: Home / Self Care | Payer: BLUE CROSS/BLUE SHIELD | Attending: Emergency Medicine | Admitting: Emergency Medicine

## 2015-06-08 ENCOUNTER — Encounter (HOSPITAL_BASED_OUTPATIENT_CLINIC_OR_DEPARTMENT_OTHER): Payer: Self-pay

## 2015-06-08 DIAGNOSIS — J45909 Unspecified asthma, uncomplicated: Secondary | ICD-10-CM | POA: Diagnosis not present

## 2015-06-08 DIAGNOSIS — Z87442 Personal history of urinary calculi: Secondary | ICD-10-CM | POA: Diagnosis not present

## 2015-06-08 DIAGNOSIS — R109 Unspecified abdominal pain: Secondary | ICD-10-CM | POA: Insufficient documentation

## 2015-06-08 DIAGNOSIS — R002 Palpitations: Secondary | ICD-10-CM | POA: Diagnosis not present

## 2015-06-08 DIAGNOSIS — Z91040 Latex allergy status: Secondary | ICD-10-CM | POA: Insufficient documentation

## 2015-06-08 DIAGNOSIS — Z3202 Encounter for pregnancy test, result negative: Secondary | ICD-10-CM | POA: Diagnosis not present

## 2015-06-08 DIAGNOSIS — R1031 Right lower quadrant pain: Secondary | ICD-10-CM | POA: Diagnosis present

## 2015-06-08 DIAGNOSIS — Z8742 Personal history of other diseases of the female genital tract: Secondary | ICD-10-CM | POA: Diagnosis not present

## 2015-06-08 DIAGNOSIS — Z9049 Acquired absence of other specified parts of digestive tract: Secondary | ICD-10-CM | POA: Diagnosis not present

## 2015-06-08 LAB — COMPREHENSIVE METABOLIC PANEL
ALK PHOS: 77 U/L (ref 38–126)
ALT: 17 U/L (ref 14–54)
AST: 17 U/L (ref 15–41)
Albumin: 3.8 g/dL (ref 3.5–5.0)
Anion gap: 9 (ref 5–15)
BUN: 13 mg/dL (ref 6–20)
CHLORIDE: 102 mmol/L (ref 101–111)
CO2: 26 mmol/L (ref 22–32)
Calcium: 8.7 mg/dL — ABNORMAL LOW (ref 8.9–10.3)
Creatinine, Ser: 0.83 mg/dL (ref 0.44–1.00)
GFR calc Af Amer: >60 mL/min (ref >60)
Glucose, Bld: 96 mg/dL (ref 65–99)
Potassium: 4 mmol/L (ref 3.5–5.1)
Sodium: 137 mmol/L (ref 135–145)
Total Bilirubin: 0.7 mg/dL (ref 0.3–1.2)
Total Protein: 7.7 g/dL (ref 6.5–8.1)

## 2015-06-08 LAB — CBC WITH DIFFERENTIAL/PLATELET
BASOS ABS: 0 10*3/uL (ref 0.0–0.1)
Basophils Relative: 0 %
Eosinophils Absolute: 0.1 10*3/uL (ref 0.0–0.7)
Eosinophils Relative: 1 %
HCT: 39.4 % (ref 36.0–46.0)
HEMOGLOBIN: 12.6 g/dL (ref 12.0–15.0)
LYMPHS PCT: 23 %
Lymphs Abs: 2.9 10*3/uL (ref 0.7–4.0)
MCH: 27 pg (ref 26.0–34.0)
MCHC: 32 g/dL (ref 30.0–36.0)
MCV: 84.4 fL (ref 78.0–100.0)
Monocytes Absolute: 1 10*3/uL (ref 0.1–1.0)
Monocytes Relative: 8 %
NEUTROS ABS: 8.8 10*3/uL — AB (ref 1.7–7.7)
NEUTROS PCT: 68 %
Platelets: 280 10*3/uL (ref 150–400)
RBC: 4.67 MIL/uL (ref 3.87–5.11)
RDW: 14.1 % (ref 11.5–15.5)
WBC: 12.8 10*3/uL — ABNORMAL HIGH (ref 4.0–10.5)

## 2015-06-08 LAB — URINALYSIS, ROUTINE W REFLEX MICROSCOPIC
Bilirubin Urine: NEGATIVE
Glucose, UA: NEGATIVE mg/dL
Ketones, ur: NEGATIVE mg/dL
Leukocytes, UA: NEGATIVE
Nitrite: NEGATIVE
PH: 5.5 (ref 5.0–8.0)
Protein, ur: NEGATIVE mg/dL
SPECIFIC GRAVITY, URINE: 1.019 (ref 1.005–1.030)

## 2015-06-08 LAB — PREGNANCY, URINE: PREG TEST UR: NEGATIVE

## 2015-06-08 LAB — LIPASE, BLOOD: Lipase: 31 U/L (ref 11–51)

## 2015-06-08 LAB — URINE MICROSCOPIC-ADD ON

## 2015-06-08 MED ORDER — TRAMADOL HCL 50 MG PO TABS
50.0000 mg | ORAL_TABLET | Freq: Four times a day (QID) | ORAL | Status: DC | PRN
Start: 2015-06-08 — End: 2015-08-05

## 2015-06-08 MED ORDER — IOHEXOL 300 MG/ML  SOLN
100.0000 mL | Freq: Once | INTRAMUSCULAR | Status: AC | PRN
  Administered 2015-06-08: 100 mL via INTRAVENOUS

## 2015-06-08 MED ORDER — IOHEXOL 300 MG/ML  SOLN
25.0000 mL | Freq: Once | INTRAMUSCULAR | Status: AC | PRN
  Administered 2015-06-08: 25 mL via ORAL

## 2015-06-08 NOTE — ED Notes (Signed)
C/o right side abd pain and feeling like heart racing x today-NAD

## 2015-06-08 NOTE — ED Notes (Signed)
Pt verbalizes understanding of discharge instructions. No further needs or questions at this time.

## 2015-06-08 NOTE — Discharge Instructions (Signed)
Tramadol as prescribed as needed for pain.  Return to the emergency department if you develop worsening pain, high fever, bloody stools, or other new and concerning symptoms.   Abdominal Pain, Adult Many things can cause abdominal pain. Usually, abdominal pain is not caused by a disease and will improve without treatment. It can often be observed and treated at home. Your health care provider will do a physical exam and possibly order blood tests and X-rays to help determine the seriousness of your pain. However, in many cases, more time must pass before a clear cause of the pain can be found. Before that point, your health care provider may not know if you need more testing or further treatment. HOME CARE INSTRUCTIONS Monitor your abdominal pain for any changes. The following actions may help to alleviate any discomfort you are experiencing:  Only take over-the-counter or prescription medicines as directed by your health care provider.  Do not take laxatives unless directed to do so by your health care provider.  Try a clear liquid diet (broth, tea, or water) as directed by your health care provider. Slowly move to a bland diet as tolerated. SEEK MEDICAL CARE IF:  You have unexplained abdominal pain.  You have abdominal pain associated with nausea or diarrhea.  You have pain when you urinate or have a bowel movement.  You experience abdominal pain that wakes you in the night.  You have abdominal pain that is worsened or improved by eating food.  You have abdominal pain that is worsened with eating fatty foods.  You have a fever. SEEK IMMEDIATE MEDICAL CARE IF:  Your pain does not go away within 2 hours.  You keep throwing up (vomiting).  Your pain is felt only in portions of the abdomen, such as the right side or the left lower portion of the abdomen.  You pass bloody or black tarry stools. MAKE SURE YOU:  Understand these instructions.  Will watch your condition.  Will  get help right away if you are not doing well or get worse.   This information is not intended to replace advice given to you by your health care provider. Make sure you discuss any questions you have with your health care provider.   Document Released: 02/23/2005 Document Revised: 02/04/2015 Document Reviewed: 01/23/2013 Elsevier Interactive Patient Education [employer]2016 [employer]

## 2015-06-08 NOTE — ED Provider Notes (Signed)
CSN: [id number]     Arrival date & time 06/08/15  1739 History  By signing my name below, I, [name], attest that this documentation has been prepared under the direction and in the presence of Geoffery Lyons, MD. Electronically Signed: Budd Young, ED Scribe. 06/08/2015. 6:04 PM.    Chief Complaint  Patient presents with  . Abdominal Pain   Patient is a 35 y.o. female presenting with abdominal pain. The history is provided by the patient. No language interpreter was used.  Abdominal Pain Pain location:  RLQ and RUQ Pain quality: cramping   Pain severity:  Moderate Onset quality:  Gradual Duration:  1 day Timing:  Constant Progression:  Worsening Chronicity:  New Relieved by:  Nothing Ineffective treatments:  None tried Associated symptoms: no dysuria, no fever and no hematuria    HPI Comments: [name] is a 35 y.o. female with a PMHx of endometriosis and kidney stones who presents to the Emergency Department complaining of intermittent, right-sided abdominal pain onset today, growing increasingly worse, cramping, and constant as of 1 hour ago. She reports associated intermittent heart "fluttering or racing" all day. She denies a PMHx of the same. She notes she normally drinks a large amount of caffeine, but has not had very much today. She reports her LNMP was on 1/1 and normal. She endorses a PSHx of cholecystectomy, C-section (2x), kidney stone surgery, and laser treatment for endometriosis. She notes this pain feels somewhat similar to the pains she had with endometriosis. Pt denies fever, hematuria, and dysuria.   Past Medical History  Diagnosis Date  . Allergy-induced asthma   . Endometriosis   . Kidney stones   . Endometriosis    Past Surgical History  Procedure Laterality Date  . Cesarean section      x2  . Cholecystectomy    . Kidney stone surgery    . Laser treatment for endometriosis  age 77  . Laparoscopy  age 55   No family history on  file. Social History  Substance Use Topics  . Smoking status: Never Smoker   . Smokeless tobacco: None  . Alcohol Use: No   OB History    No data available     Review of Systems  Constitutional: Negative for fever.  Cardiovascular: Positive for palpitations.  Gastrointestinal: Positive for abdominal pain.  Genitourinary: Negative for dysuria and hematuria.  All other systems reviewed and are negative.   Allergies  Latex and Sulfa antibiotics  Home Medications   Prior to Admission medications   Not on File   BP 100/56 mmHg  Pulse 85  Temp(Src) 98.2 F (36.8 C) (Oral)  Resp 19  Ht 5\' 6"  (1.676 m)  Wt 275 lb (124.739 kg)  BMI 44.41 kg/m2  SpO2 100%  LMP 05/31/2015 Physical Exam  Constitutional: She is oriented to person, place, and time. She appears well-developed and well-nourished.  HENT:  Head: Normocephalic and atraumatic.  Eyes: Conjunctivae are normal. Right eye exhibits no discharge. Left eye exhibits no discharge.  Pulmonary/Chest: Effort normal. No respiratory distress.  Abdominal: Soft. There is tenderness. There is no rebound and no guarding.  Slight TTP in the right mid-abdomen  Neurological: She is alert and oriented to person, place, and time. Coordination normal.  Skin: Skin is warm and dry. No rash noted. She is not diaphoretic. No erythema.  Psychiatric: She has a normal mood and affect.  Nursing note and vitals reviewed.   ED Course  Procedures  DIAGNOSTIC STUDIES: Oxygen Saturation  is 97% on RA, adequate by my interpretation.    COORDINATION OF CARE: 6:00 PM - Discussed plans to wait on diagnostic studies. Pt advised of plan for treatment and pt agrees.  Labs Review Labs Reviewed  URINALYSIS, ROUTINE W REFLEX MICROSCOPIC (NOT AT [facility]) - Abnormal; Notable for the following:    APPearance CLOUDY (*)    Hgb urine dipstick SMALL (*)    All other components within normal limits  COMPREHENSIVE METABOLIC PANEL - Abnormal; Notable for the  following:    Calcium 8.7 (*)    All other components within normal limits  CBC WITH DIFFERENTIAL/PLATELET - Abnormal; Notable for the following:    WBC 12.8 (*)    Neutro Abs 8.8 (*)    All other components within normal limits  URINE MICROSCOPIC-ADD ON - Abnormal; Notable for the following:    Squamous Epithelial / LPF 6-30 (*)    Bacteria, UA FEW (*)    All other components within normal limits  PREGNANCY, URINE  LIPASE, BLOOD    Imaging Review Ct Abdomen Pelvis W Contrast  06/08/2015  CLINICAL DATA:  35 year old female with right lower quadrant abdominal pain. EXAM: CT ABDOMEN AND PELVIS WITH CONTRAST TECHNIQUE: Multidetector CT imaging of the abdomen and pelvis was performed using the standard protocol following bolus administration of intravenous contrast. CONTRAST:  25mL OMNIPAQUE IOHEXOL 300 MG/ML SOLN, OMNIPAQUE IOHEXOL 300 MG/ML SOLN COMPARISON:  CT dated 07/10/2014 FINDINGS: The visualized lung bases are clear. No intra-abdominal free air or free fluid. Cholecystectomy. The liver, pancreas, spleen, adrenal glands, kidneys, visualized ureters appear unremarkable. The urinary bladder is collapsed. The uterus is anteverted. There bilateral ovarian cysts. There multiple cysts or a complex/septated cyst in the left ovary. Ultrasound may provide better evaluation of the pelvic structures. There is moderate stool throughout the colon. No evidence of bowel obstruction or inflammation. The appendix is not identified with certainty. A tubular structure extending from the cecum posteriorly adjacent to the right ovary may represent a normal appendix. No inflammatory changes identified in the right lower quadrant or pericecal region. The abdominal aorta appears unremarkable. 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 No portal venous gas identified. There is no adenopathy. Small fat containing umbilical hernia. The abdominal wall soft tissues are otherwise unremarkable. The osseous  structures are intact. IMPRESSION: No acute intra-abdominal or pelvic pathology. Bilateral ovarian small cysts. Ultrasound may provide better evaluation. Electronically Signed   By: Elgie Collard M.D.   On: 06/08/2015 21:14   I have personally reviewed and evaluated these images and lab results as part of my medical decision-making.   EKG Interpretation   Date/Time:  Monday June 08 2015 17:48:57 EST Ventricular Rate:  100 PR Interval:  142 QRS Duration: 82 QT Interval:  338 QTC Calculation: 436 R Axis:   8 Text Interpretation:  Normal sinus rhythm Cannot rule out Anterior infarct  , age undetermined Abnormal ECG Confirmed by Malisha Mabey  MD, Hilman Kissling ([id number]) on  06/08/2015 5:54:36 PM      MDM   Final diagnoses:  None    Patient presents with complaints of right lower abdominal pain that has worsened over the past 8 hours. She is tender to palpation in the right mid and right lower abdomen, however there are no peritoneal signs. She did have a mild elevation of her white count. For this reason, a CT scan was obtained which reveals no evidence for acute abdominal or pelvic pathology. She has small ovarian cyst that I believe are  likely incidental findings. She will be discharged with tramadol and when necessary return.  I personally performed the services described in this documentation, which was scribed in my presence. The recorded information has been reviewed and is accurate.       Geoffery Lyonsouglas Nkenge Sonntag, MD 06/08/15 2135

## 2015-07-21 NOTE — H&P (Signed)
[name]  DICTATION # [phone] CSN# [id number]   Meriel Pica, MD 07/21/2015 11:53 AM

## 2015-07-22 NOTE — H&P (Signed)
[name], [name] NO.:  [id number]  MEDICAL RECORD NO.:  [id number]  LOCATION:                                 FACILITY:  PHYSICIAN:  Duke Salvia. Marcelle Overlie, M.D.DATE OF BIRTH:  1980-08-05  DATE OF ADMISSION:  08/03/2015 DATE OF DISCHARGE:                             HISTORY & PHYSICAL   CHIEF COMPLAINT:  Pelvic pain.  History of endometriosis.  HISTORY OF PRESENT ILLNESS:  A 35 year old, G2, P2 was evaluated about a year ago here after not being seen since 2007.  At that time April of 2016 she had a CT that was done April 01, 2015 for evaluation of left lower quadrant pain and history of stones with clear urine.  It showed 2 left ovarian cyst both small, 1 was 2.5 possibly hemorrhagic.  Followup ultrasound here showed on February 17 showed right ovary 1.8 x 1.3 cm cyst on the left 1.9 x 1.2 paraovarian cyst.  Her history is significant for a prior diagnosis of endometriosis.  She has had 2 prior cesareans, laparoscopy for problems related to pain and endometriosis.  We discussed a number of options with her including another diagnostic laparoscopy, D and C, hysteroscopy with ablation, trial of Lupron or definitive TAH possible BSO which she would prefer due to ongoing problems with the pain.  This procedure including specific risks related to bleeding, infection, adjacent organ injury, the possible need for ERT.  Other risks related to wound infection, phlebitis, and her expected recovery time all discussed which she understands and accepts.  PAST MEDICAL HISTORY:  Operations:  Cesarean section x2. Cholecystectomy, kidney stone surgery, laser treatment for endometriosis and laparoscopy.  The laser treatment was at age 50.  SOCIAL HISTORY:  Denies alcohol, tobacco, or drug use.  ALLERGIES:  Latex and sulfa.  FAMILY HISTORY:  Significant for heart disease, asthma, gallbladder disease, UTI, arthritis, diabetes and unspecified cancer.  PHYSICAL EXAM:   VITAL SIGNS:  Temperature 98.2, blood pressure 116/80. Weight was 286. HEENT:  Unremarkable. NECK:  Supple without masses. LUNGS:  Clear. CARDIOVASCULAR:  Regular rate and rhythm without murmurs, rubs, gallops found. BREASTS:  Without masses. ABDOMEN:  Soft, flat, nontender.  A well-healed C-section scar. EXTREMITIES:  Unremarkable. NEUROLOGIC EXAM:  Unremarkable. Vulva vagina cervix was normal.  Last Pap dated April 16 was normal. Uterus mid position, minimally tender.  Adnexa unremarkable.  No unusual nodularity or masses noted.  IMPRESSION:  Chronic pelvic pain, history of pelvic endometriosis, declines conservative treatment, scheduled for TAH possible BSO.  This procedure including specific risks discussed as above.     Tymika Grilli M. Marcelle Overlie, M.D.     RMH/MEDQ  D:  07/21/2015  T:  07/21/2015  Job:  [id number]

## 2015-07-28 NOTE — Patient Instructions (Signed)
Your procedure is scheduled on:  Monday, August 03, 2015  Enter through the Main Entrance of [facility] at:  6:00 AM  Pick up the phone at the desk and dial [phone].  Call this number if you have problems the morning of surgery: [phone].  Remember:  Do NOT eat food or drink after:  Midnight Sunday  Take these medicines the morning of surgery with a SIP OF WATER: None  Do NOT wear jewelry (body piercing), metal hair clips/bobby pins, make-up, or nail polish. Do NOT wear lotions, powders, or perfumes.  You may wear deoderant. Do NOT shave for 48 hours prior to surgery. Do NOT bring valuables to the hospital. Contacts, dentures, or bridgework may not be worn into surgery.  Leave suitcase in car.  After surgery it may be brought to your room.  For patients admitted to the hospital, checkout time is 11:00 AM the day of discharge.

## 2015-07-29 ENCOUNTER — Encounter (HOSPITAL_COMMUNITY): Payer: Self-pay

## 2015-07-29 ENCOUNTER — Encounter (HOSPITAL_COMMUNITY)
Admission: RE | Admit: 2015-07-29 | Discharge: 2015-07-29 | Disposition: A | Discharge status: Home / Self Care | Payer: BLUE CROSS/BLUE SHIELD | Source: Ambulatory Visit | Attending: Obstetrics and Gynecology | Admitting: Obstetrics and Gynecology

## 2015-07-29 DIAGNOSIS — Z01812 Encounter for preprocedural laboratory examination: Secondary | ICD-10-CM | POA: Insufficient documentation

## 2015-07-29 HISTORY — DX: Headache, unspecified: R51.9

## 2015-07-29 HISTORY — DX: Headache: R51

## 2015-07-29 HISTORY — DX: Pneumonia, unspecified organism: J18.9

## 2015-07-29 HISTORY — DX: Essential (primary) hypertension: I10

## 2015-07-29 LAB — CBC
HEMATOCRIT: 38 % (ref 36.0–46.0)
HEMOGLOBIN: 12.4 g/dL (ref 12.0–15.0)
MCH: 27.3 pg (ref 26.0–34.0)
MCHC: 32.6 g/dL (ref 30.0–36.0)
MCV: 83.5 fL (ref 78.0–100.0)
Platelets: 270 10*3/uL (ref 150–400)
RBC: 4.55 MIL/uL (ref 3.87–5.11)
RDW: 14.5 % (ref 11.5–15.5)
WBC: 9.6 10*3/uL (ref 4.0–10.5)

## 2015-07-29 LAB — TYPE AND SCREEN
ABO/RH(D): A POS
ANTIBODY SCREEN: NEGATIVE

## 2015-07-29 LAB — ABO/RH: ABO/RH(D): A POS

## 2015-08-02 MED ORDER — DEXTROSE 5 % IV SOLN
2.0000 g | INTRAVENOUS | Status: AC
  Administered 2015-08-03: 2 g via INTRAVENOUS
  Filled 2015-08-02: qty 2

## 2015-08-03 ENCOUNTER — Inpatient Hospital Stay (HOSPITAL_COMMUNITY): Payer: BLUE CROSS/BLUE SHIELD | Admitting: Anesthesiology

## 2015-08-03 ENCOUNTER — Encounter (HOSPITAL_COMMUNITY)
Admission: RE | Disposition: A | Discharge status: Home / Self Care | Payer: Self-pay | Source: Ambulatory Visit | Attending: Obstetrics and Gynecology

## 2015-08-03 ENCOUNTER — Encounter (HOSPITAL_COMMUNITY): Payer: Self-pay | Admitting: Emergency Medicine

## 2015-08-03 ENCOUNTER — Inpatient Hospital Stay (HOSPITAL_COMMUNITY)
Admission: RE | Admit: 2015-08-03 | Discharge: 2015-08-05 | DRG: 742 | Disposition: A | Discharge status: Home / Self Care | Payer: BLUE CROSS/BLUE SHIELD | Source: Ambulatory Visit | Attending: Obstetrics and Gynecology | Admitting: Obstetrics and Gynecology

## 2015-08-03 DIAGNOSIS — R102 Pelvic and perineal pain: Secondary | ICD-10-CM | POA: Diagnosis present

## 2015-08-03 DIAGNOSIS — M25559 Pain in unspecified hip: Secondary | ICD-10-CM | POA: Diagnosis present

## 2015-08-03 DIAGNOSIS — G8929 Other chronic pain: Secondary | ICD-10-CM | POA: Diagnosis present

## 2015-08-03 DIAGNOSIS — Z6841 Body Mass Index (BMI) 40.0 and over, adult: Secondary | ICD-10-CM | POA: Diagnosis not present

## 2015-08-03 DIAGNOSIS — I10 Essential (primary) hypertension: Secondary | ICD-10-CM | POA: Diagnosis present

## 2015-08-03 DIAGNOSIS — N803 Endometriosis of pelvic peritoneum: Principal | ICD-10-CM | POA: Diagnosis present

## 2015-08-03 DIAGNOSIS — N801 Endometriosis of ovary: Secondary | ICD-10-CM | POA: Diagnosis present

## 2015-08-03 HISTORY — PX: SALPINGOOPHORECTOMY: SHX82

## 2015-08-03 HISTORY — PX: ABDOMINAL HYSTERECTOMY: SHX81

## 2015-08-03 HISTORY — DX: Other specified postprocedural states: Z98.890

## 2015-08-03 HISTORY — DX: Nausea with vomiting, unspecified: R11.2

## 2015-08-03 LAB — PREGNANCY, URINE: Preg Test, Ur: NEGATIVE

## 2015-08-03 SURGERY — HYSTERECTOMY, ABDOMINAL
Anesthesia: General

## 2015-08-03 MED ORDER — SUFENTANIL CITRATE 50 MCG/ML IV SOLN
INTRAVENOUS | Status: DC | PRN
  Administered 2015-08-03 (×2): 5 ug via INTRAVENOUS
  Administered 2015-08-03: 10 ug via INTRAVENOUS
  Administered 2015-08-03: 5 ug via INTRAVENOUS
  Administered 2015-08-03: 10 ug via INTRAVENOUS

## 2015-08-03 MED ORDER — PROPOFOL 10 MG/ML IV BOLUS
INTRAVENOUS | Status: AC
  Filled 2015-08-03: qty 20

## 2015-08-03 MED ORDER — OXYCODONE-ACETAMINOPHEN 5-325 MG PO TABS
1.0000 | ORAL_TABLET | ORAL | Status: DC | PRN
  Administered 2015-08-04: 2 via ORAL
  Administered 2015-08-04 – 2015-08-05 (×2): 1 via ORAL
  Administered 2015-08-05 (×2): 2 via ORAL
  Filled 2015-08-03: qty 1
  Filled 2015-08-03 (×2): qty 2
  Filled 2015-08-03: qty 1
  Filled 2015-08-03: qty 2

## 2015-08-03 MED ORDER — NALOXONE HCL 0.4 MG/ML IJ SOLN: 0.4000 mg | INTRAMUSCULAR | Status: DC | PRN

## 2015-08-03 MED ORDER — ONDANSETRON HCL 4 MG/2ML IJ SOLN
4.0000 mg | Freq: Four times a day (QID) | INTRAMUSCULAR | Status: DC | PRN
  Administered 2015-08-03: 4 mg via INTRAVENOUS

## 2015-08-03 MED ORDER — PNEUMOCOCCAL VAC POLYVALENT 25 MCG/0.5ML IJ INJ
0.5000 mL | INJECTION | INTRAMUSCULAR | Status: AC
  Administered 2015-08-05: 0.5 mL via INTRAMUSCULAR
  Filled 2015-08-03: qty 0.5

## 2015-08-03 MED ORDER — GLYCOPYRROLATE 0.2 MG/ML IJ SOLN
INTRAMUSCULAR | Status: AC
  Filled 2015-08-03: qty 3

## 2015-08-03 MED ORDER — SCOPOLAMINE 1 MG/3DAYS TD PT72
MEDICATED_PATCH | TRANSDERMAL | Status: AC
  Administered 2015-08-03: 1.5 mg via TRANSDERMAL
  Filled 2015-08-03: qty 1

## 2015-08-03 MED ORDER — BUPIVACAINE LIPOSOME 1.3 % IJ SUSP
20.0000 mL | Freq: Once | INTRAMUSCULAR | Status: AC
  Administered 2015-08-03: 20 mL
  Filled 2015-08-03: qty 20

## 2015-08-03 MED ORDER — ONDANSETRON HCL 4 MG/2ML IJ SOLN
INTRAMUSCULAR | Status: AC
  Filled 2015-08-03: qty 2

## 2015-08-03 MED ORDER — BUTORPHANOL TARTRATE 1 MG/ML IJ SOLN: 1.0000 mg | INTRAMUSCULAR | Status: DC | PRN

## 2015-08-03 MED ORDER — SUCCINYLCHOLINE CHLORIDE 20 MG/ML IJ SOLN
INTRAMUSCULAR | Status: DC | PRN
  Administered 2015-08-03: 140 mg via INTRAVENOUS

## 2015-08-03 MED ORDER — HYDROMORPHONE HCL 1 MG/ML IJ SOLN
0.2500 mg | INTRAMUSCULAR | Status: DC | PRN
  Administered 2015-08-03 (×3): 0.5 mg via INTRAVENOUS
  Administered 2015-08-03 (×2): 0.25 mg via INTRAVENOUS

## 2015-08-03 MED ORDER — DIPHENHYDRAMINE HCL 12.5 MG/5ML PO ELIX: 12.5000 mg | ORAL_SOLUTION | Freq: Four times a day (QID) | ORAL | Status: DC | PRN

## 2015-08-03 MED ORDER — DIPHENHYDRAMINE HCL 50 MG/ML IJ SOLN: 12.5000 mg | Freq: Four times a day (QID) | INTRAMUSCULAR | Status: DC | PRN

## 2015-08-03 MED ORDER — PROPOFOL 10 MG/ML IV BOLUS
INTRAVENOUS | Status: DC | PRN
  Administered 2015-08-03: 250 mg via INTRAVENOUS

## 2015-08-03 MED ORDER — ROCURONIUM BROMIDE 100 MG/10ML IV SOLN
INTRAVENOUS | Status: AC
  Filled 2015-08-03: qty 1

## 2015-08-03 MED ORDER — SODIUM CHLORIDE 0.9% FLUSH: 9.0000 mL | INTRAVENOUS | Status: DC | PRN

## 2015-08-03 MED ORDER — LIDOCAINE HCL (CARDIAC) 20 MG/ML IV SOLN
INTRAVENOUS | Status: AC
Start: 2015-08-03 — End: 2015-08-03
  Filled 2015-08-03: qty 5

## 2015-08-03 MED ORDER — ONDANSETRON HCL 4 MG PO TABS
4.0000 mg | ORAL_TABLET | Freq: Four times a day (QID) | ORAL | Status: DC | PRN
  Administered 2015-08-05: 4 mg via ORAL
  Filled 2015-08-03: qty 1

## 2015-08-03 MED ORDER — ONDANSETRON HCL 4 MG/2ML IJ SOLN
4.0000 mg | Freq: Four times a day (QID) | INTRAMUSCULAR | Status: DC | PRN
Start: 2015-08-03 — End: 2015-08-05
  Filled 2015-08-03: qty 2

## 2015-08-03 MED ORDER — HYDROMORPHONE HCL 1 MG/ML IJ SOLN
INTRAMUSCULAR | Status: DC | PRN
  Administered 2015-08-03: 1 mg via INTRAVENOUS

## 2015-08-03 MED ORDER — DEXAMETHASONE SODIUM PHOSPHATE 10 MG/ML IJ SOLN
INTRAMUSCULAR | Status: DC | PRN
  Administered 2015-08-03: 10 mg via INTRAVENOUS

## 2015-08-03 MED ORDER — NEOSTIGMINE METHYLSULFATE 10 MG/10ML IV SOLN
INTRAVENOUS | Status: DC | PRN
  Administered 2015-08-03: 4 mg via INTRAVENOUS

## 2015-08-03 MED ORDER — KETOROLAC TROMETHAMINE 30 MG/ML IJ SOLN
INTRAMUSCULAR | Status: DC | PRN
  Administered 2015-08-03: 30 mg via INTRAVENOUS

## 2015-08-03 MED ORDER — ROCURONIUM BROMIDE 100 MG/10ML IV SOLN
INTRAVENOUS | Status: DC | PRN
  Administered 2015-08-03: 50 mg via INTRAVENOUS

## 2015-08-03 MED ORDER — LIDOCAINE HCL (CARDIAC) 20 MG/ML IV SOLN
INTRAVENOUS | Status: DC | PRN
  Administered 2015-08-03: 40 mg via INTRAVENOUS

## 2015-08-03 MED ORDER — HYDROMORPHONE HCL 1 MG/ML IJ SOLN
INTRAMUSCULAR | Status: AC
  Filled 2015-08-03: qty 1

## 2015-08-03 MED ORDER — MENTHOL 3 MG MT LOZG
1.0000 | LOZENGE | OROMUCOSAL | Status: DC | PRN
  Administered 2015-08-03: 3 mg via ORAL

## 2015-08-03 MED ORDER — SODIUM CHLORIDE 0.9 % IJ SOLN
INTRAMUSCULAR | Status: AC
  Filled 2015-08-03: qty 100

## 2015-08-03 MED ORDER — LACTATED RINGERS IV SOLN
INTRAVENOUS | Status: DC
  Administered 2015-08-03 (×3): via INTRAVENOUS

## 2015-08-03 MED ORDER — OXYCODONE HCL 5 MG/5ML PO SOLN: 5.0000 mg | Freq: Once | ORAL | Status: DC | PRN

## 2015-08-03 MED ORDER — IBUPROFEN 800 MG PO TABS
800.0000 mg | ORAL_TABLET | Freq: Three times a day (TID) | ORAL | Status: DC | PRN
  Administered 2015-08-04 – 2015-08-05 (×2): 800 mg via ORAL
  Filled 2015-08-03 (×2): qty 1

## 2015-08-03 MED ORDER — KETOROLAC TROMETHAMINE 30 MG/ML IJ SOLN
INTRAMUSCULAR | Status: AC
  Filled 2015-08-03: qty 1

## 2015-08-03 MED ORDER — MORPHINE SULFATE 2 MG/ML IV SOLN
INTRAVENOUS | Status: DC
Start: 2015-08-03 — End: 2015-08-04
  Administered 2015-08-03: 13.5 mg via INTRAVENOUS
  Administered 2015-08-03: 27 mg via INTRAVENOUS
  Administered 2015-08-04 (×2): 4 mg via INTRAVENOUS
  Filled 2015-08-03: qty 25

## 2015-08-03 MED ORDER — OXYCODONE HCL 5 MG PO TABS: 5.0000 mg | ORAL_TABLET | Freq: Once | ORAL | Status: DC | PRN

## 2015-08-03 MED ORDER — DEXTROSE IN LACTATED RINGERS 5 % IV SOLN
INTRAVENOUS | Status: DC
  Administered 2015-08-03 – 2015-08-04 (×2): via INTRAVENOUS

## 2015-08-03 MED ORDER — GLYCOPYRROLATE 0.2 MG/ML IJ SOLN
INTRAMUSCULAR | Status: DC | PRN
  Administered 2015-08-03: 0.1 mg via INTRAVENOUS
  Administered 2015-08-03: .6 mg via INTRAVENOUS

## 2015-08-03 MED ORDER — KETOROLAC TROMETHAMINE 30 MG/ML IJ SOLN: 30.0000 mg | Freq: Once | INTRAMUSCULAR | Status: DC

## 2015-08-03 MED ORDER — KETOROLAC TROMETHAMINE 30 MG/ML IJ SOLN: 30.0000 mg | Freq: Four times a day (QID) | INTRAMUSCULAR | Status: DC

## 2015-08-03 MED ORDER — NEOSTIGMINE METHYLSULFATE 10 MG/10ML IV SOLN
INTRAVENOUS | Status: AC
  Filled 2015-08-03: qty 1

## 2015-08-03 MED ORDER — ONDANSETRON HCL 4 MG/2ML IJ SOLN
4.0000 mg | Freq: Four times a day (QID) | INTRAMUSCULAR | Status: AC | PRN
  Administered 2015-08-03: 4 mg via INTRAVENOUS

## 2015-08-03 MED ORDER — HYDROMORPHONE HCL 1 MG/ML IJ SOLN
INTRAMUSCULAR | Status: AC
  Administered 2015-08-03: 0.25 mg via INTRAVENOUS
  Filled 2015-08-03: qty 1

## 2015-08-03 MED ORDER — HYDROMORPHONE HCL 1 MG/ML IJ SOLN
INTRAMUSCULAR | Status: AC
  Administered 2015-08-03: 0.5 mg via INTRAVENOUS
  Filled 2015-08-03: qty 1

## 2015-08-03 MED ORDER — SUFENTANIL CITRATE 50 MCG/ML IV SOLN
INTRAVENOUS | Status: AC
  Filled 2015-08-03: qty 1

## 2015-08-03 MED ORDER — SODIUM CHLORIDE 0.9 % IJ SOLN
INTRAMUSCULAR | Status: DC | PRN
  Administered 2015-08-03: 100 mL

## 2015-08-03 MED ORDER — KETOROLAC TROMETHAMINE 30 MG/ML IJ SOLN
30.0000 mg | Freq: Four times a day (QID) | INTRAMUSCULAR | Status: DC
  Administered 2015-08-03 – 2015-08-04 (×3): 30 mg via INTRAVENOUS
  Filled 2015-08-03 (×3): qty 1

## 2015-08-03 MED ORDER — MIDAZOLAM HCL 2 MG/2ML IJ SOLN
INTRAMUSCULAR | Status: DC | PRN
  Administered 2015-08-03 (×2): 1 mg via INTRAVENOUS

## 2015-08-03 MED ORDER — SCOPOLAMINE 1 MG/3DAYS TD PT72
1.0000 | MEDICATED_PATCH | Freq: Once | TRANSDERMAL | Status: AC
  Administered 2015-08-03: 1.5 mg via TRANSDERMAL
  Administered 2015-08-03: 1 via TRANSDERMAL

## 2015-08-03 MED ORDER — ONDANSETRON HCL 4 MG/2ML IJ SOLN
INTRAMUSCULAR | Status: DC | PRN
  Administered 2015-08-03: 2 mg via INTRAVENOUS

## 2015-08-03 MED ORDER — DEXAMETHASONE SODIUM PHOSPHATE 10 MG/ML IJ SOLN
INTRAMUSCULAR | Status: AC
  Filled 2015-08-03: qty 1

## 2015-08-03 MED ORDER — MIDAZOLAM HCL 2 MG/2ML IJ SOLN
INTRAMUSCULAR | Status: AC
  Filled 2015-08-03: qty 2

## 2015-08-03 MED ORDER — SODIUM CHLORIDE 0.9 % IJ SOLN
INTRAMUSCULAR | Status: AC
  Filled 2015-08-03: qty 10

## 2015-08-03 SURGICAL SUPPLY — 47 items
APL SKNCLS STERI-STRIP NONHPOA (GAUZE/BANDAGES/DRESSINGS) ×2
BARRIER ADHS 3X4 INTERCEED (GAUZE/BANDAGES/DRESSINGS) IMPLANT
BENZOIN TINCTURE PRP APPL 2/3 (GAUZE/BANDAGES/DRESSINGS) ×3 IMPLANT
BRR ADH 4X3 ABS CNTRL BYND (GAUZE/BANDAGES/DRESSINGS)
CANISTER SUCT 3000ML (MISCELLANEOUS) ×3 IMPLANT
CLOTH BEACON ORANGE TIMEOUT ST (SAFETY) ×3 IMPLANT
CONT PATH 16OZ SNAP LID 3702 (MISCELLANEOUS) ×3 IMPLANT
DECANTER SPIKE VIAL GLASS SM (MISCELLANEOUS) ×4 IMPLANT
DRAPE CESAREAN BIRTH W POUCH (DRAPES) ×3 IMPLANT
DRAPE WARM FLUID 44X44 (DRAPE) ×3 IMPLANT
DRSG OPSITE POSTOP 4X10 (GAUZE/BANDAGES/DRESSINGS) ×3 IMPLANT
DURAPREP 26ML APPLICATOR (WOUND CARE) ×3 IMPLANT
ELECT LIGASURE SHORT 9 REUSE (ELECTRODE) IMPLANT
GAUZE SPONGE 4X4 16PLY XRAY LF (GAUZE/BANDAGES/DRESSINGS) IMPLANT
GLOVE BIO SURGEON STRL SZ7 (GLOVE) ×6 IMPLANT
GLOVE BIOGEL PI IND STRL 7.0 (GLOVE) ×6 IMPLANT
GLOVE BIOGEL PI INDICATOR 7.0 (GLOVE) ×3
GOWN STRL REUS W/TWL LRG LVL3 (GOWN DISPOSABLE) ×9 IMPLANT
NDL HYPO 18GX1.5 BLUNT FILL (NEEDLE) IMPLANT
NEEDLE HYPO 18GX1.5 BLUNT FILL (NEEDLE) IMPLANT
NEEDLE HYPO 22GX1.5 SAFETY (NEEDLE) ×3 IMPLANT
NS IRRIG 1000ML POUR BTL (IV SOLUTION) ×5 IMPLANT
PACK ABDOMINAL GYN (CUSTOM PROCEDURE TRAY) ×3 IMPLANT
PAD ABD 7.5X8 STRL (GAUZE/BANDAGES/DRESSINGS) ×2 IMPLANT
PAD OB MATERNITY 4.3X12.25 (PERSONAL CARE ITEMS) ×3 IMPLANT
PENCIL SMOKE EVAC W/HOLSTER (ELECTROSURGICAL) ×2 IMPLANT
SPONGE GAUZE 4X4 12PLY STER LF (GAUZE/BANDAGES/DRESSINGS) ×2 IMPLANT
SPONGE LAP 18X18 X RAY DECT (DISPOSABLE) ×5 IMPLANT
STRIP CLOSURE SKIN 1/2X4 (GAUZE/BANDAGES/DRESSINGS) ×3 IMPLANT
SUT CHROMIC 3 0 SH 27 (SUTURE) IMPLANT
SUT MON AB 2-0 CT1 36 (SUTURE) ×3 IMPLANT
SUT MON AB 4-0 PS1 27 (SUTURE) ×3 IMPLANT
SUT PDS AB 0 CT1 27 (SUTURE) ×6 IMPLANT
SUT PDS AB 0 CTX 60 (SUTURE) ×2 IMPLANT
SUT VIC AB 0 CT1 18XCR BRD8 (SUTURE) ×5 IMPLANT
SUT VIC AB 0 CT1 27 (SUTURE) ×3
SUT VIC AB 0 CT1 27XBRD ANBCTR (SUTURE) ×2 IMPLANT
SUT VIC AB 0 CT1 8-18 (SUTURE) ×9
SUT VIC AB 2-0 CT1 27 (SUTURE)
SUT VIC AB 2-0 CT1 TAPERPNT 27 (SUTURE) IMPLANT
SUT VIC AB 3-0 CT1 27 (SUTURE) ×3
SUT VIC AB 3-0 CT1 TAPERPNT 27 (SUTURE) ×2 IMPLANT
SUT VICRYL 0 TIES 12 18 (SUTURE) ×3 IMPLANT
SYR 20CC LL (SYRINGE) ×5 IMPLANT
TOWEL OR 17X24 6PK STRL BLUE (TOWEL DISPOSABLE) ×8 IMPLANT
TRAY FOLEY CATH SILVER 14FR (SET/KITS/TRAYS/PACK) ×3 IMPLANT
WATER STERILE IRR 1000ML POUR (IV SOLUTION) ×3 IMPLANT

## 2015-08-03 NOTE — Anesthesia Procedure Notes (Signed)
Procedure Name: Intubation Date/Time: 08/03/2015 7:27 AM Performed by: Harriett RushADELOYE, Daimian Sudberry A Pre-anesthesia Checklist: Patient identified, Emergency Drugs available, Suction available and Patient being monitored Patient Re-evaluated:Patient Re-evaluated prior to inductionOxygen Delivery Method: Circle system utilized Preoxygenation: Pre-oxygenation with 100% oxygen Intubation Type: IV induction Ventilation: Mask ventilation without difficulty Laryngoscope Size: Miller and 2 Grade View: Grade II Tube type: Oral Number of attempts: 1 Airway Equipment and Method: Patient positioned with wedge pillow and Stylet Placement Confirmation: ETT inserted through vocal cords under direct vision,  positive ETCO2 and breath sounds checked- equal and bilateral Secured at: 22 cm Tube secured with: Tape Dental Injury: Teeth and Oropharynx as per pre-operative assessment

## 2015-08-03 NOTE — Op Note (Signed)
Preoperative diagnosis: Chronic pelvic pain, pelvic endometriosis  Postoperative diagnosis: Same plus dense left adnexal adhesions, small left ovarian endometrioma.  Procedure: TAH/BSO lysis of adhesions  Surgeon: Marcelle OverlieHolland  Asst.: McComb  EBL: 300 cc  Specimens removed: Uterus, bilateral tubes and ovaries, to pathology  Procedure and findings:  Patient taken the operating room after an adequate level of general anesthesia was obtained with the patient supine the abdomen prepped and draped in usual fashion, vagina prepped separately and Foley catheter positioned. Appropriate timeouts taken at that point. Pfannenstiel incision made tibial scar which is carried down the fascia extended transversely. Rectus muscle divided in the midline, peritoneum entered superiorly without incident and extended in a vertical fashion. O'Connor-O'Sullivan retractor was positioned. Patient placed in Trendelenburg bowel Speck superiorly out of the field. Initial findings showed that there were dense left round ligament adhesions, some minimal bladder flap adhesions right tube and ovary were unremarkable upper abdomen negative most of the abnormal findings frontal left with the left ovary was densely adherent to the left pelvic sidewall untried finger dissection the ovary small amount of chocolate material was extruded consistent with a small endometrioma. This was freed up with sharp and blunt dissection in an avascular plane. Once this was completed the uterus was grasped with Kelly clamps at each utero-ovarian pedicle. Starting on the left the round ligament clamped divided and suture ligated with 0 Vicryl the left IP ligament was isolated, the course of the left ureter was well below. The left IP ligament was clamped divided first free tie followed by suture ligature 0 Vicryl. The exact same repeated on the opposite side after assuring that the course of the ureter was well below. Peritoneum carried around to midline with  the bladder was sharply and bluntly dissected below the cervical vaginal junction. Uterine basket or pedicles were skeletonized, clamped divided and suture ligated. In sequential manner, staying close to the uterus, the cardinal ligament, uterosacral ligament and cervical vaginal pedicles were clamped divided and suture ligated with 0 Vicryl the specimen was thus excised. Vaginal mucosa closed with interrupted 2-0 Vicryl sutures. The pelvis is in irrigated with saline and inspected noted be hemostatic at the operative sites. Prior to closure sponge, needle, history precast reported as correct 2. Peritoneum closed the running 3-0 Vicryl suture. The same was used to reapproximate the rectus muscles in the midline. A double looped 0 PDS suture was then used to close the fascia transversely. Diluted Exparel solution was injected subcutaneous fascially and subcutaneously.. A septated tissue was irrigated noted be hemostatic the dead space closed with 3-0 Vicryl running suture. 4-0 Monocryl on the skin along with a pressure dressing. She tolerated this well went to recovery room in good condition.  Dictated with [facility]  Mackenzye Mackel Milana ObeyM Dhruva Orndoff M.D.

## 2015-08-03 NOTE — Progress Notes (Signed)
The patient was re-examined with no change in status 

## 2015-08-03 NOTE — Transfer of Care (Signed)
Immediate Anesthesia Transfer of Care Note  Patient: Loel DubonnetJennifer A Shultis  Procedure(s) Performed: Procedure(s): HYSTERECTOMY ABDOMINAL (N/A) BILATERAL SALPINGO OOPHORECTOMY (Bilateral)  Patient Location: PACU  Anesthesia Type:General  Level of Consciousness: awake, alert  and oriented  Airway & Oxygen Therapy: Patient Spontanous Breathing and Patient connected to face mask oxygen  Post-op Assessment: Report given to RN, Post -op Vital signs reviewed and stable and Patient moving all extremities X 4  Post vital signs: Reviewed and stable  Last Vitals:  Filed Vitals:   08/03/15 0608  BP: 120/85  Pulse: 77  Temp: 36.7 C  Resp: 18    Complications: No apparent anesthesia complications

## 2015-08-03 NOTE — Anesthesia Postprocedure Evaluation (Signed)
Anesthesia Post Note  Patient: Loel DubonnetJennifer A Pecora  Procedure(s) Performed: Procedure(s) (LRB): HYSTERECTOMY ABDOMINAL (N/A) BILATERAL SALPINGO OOPHORECTOMY (Bilateral)  Patient location during evaluation: PACU Anesthesia Type: General Level of consciousness: awake and alert Pain management: pain level controlled Vital Signs Assessment: post-procedure vital signs reviewed and stable Respiratory status: spontaneous breathing, nonlabored ventilation, respiratory function stable and patient connected to nasal cannula oxygen Cardiovascular status: blood pressure returned to baseline and stable Postop Assessment: no signs of nausea or vomiting Anesthetic complications: no    Last Vitals:  Filed Vitals:   08/03/15 1000 08/03/15 1030  BP: 132/72 124/72  Pulse:  61  Temp: 37 C 36.8 C  Resp:  12    Last Pain:  Filed Vitals:   08/03/15 1033  PainSc: 5                  Kathy Wares JENNETTE

## 2015-08-03 NOTE — Anesthesia Preprocedure Evaluation (Signed)
Anesthesia Evaluation  Patient identified by MRN, date of birth, ID band Patient awake    Reviewed: Allergy & Precautions, NPO status , Patient's Chart, lab work & pertinent test results  History of Anesthesia Complications (+) PONV  Airway Mallampati: II   Neck ROM: full    Dental   Pulmonary asthma , pneumonia,    breath sounds clear to auscultation       Cardiovascular hypertension,  Rhythm:regular Rate:Normal     Neuro/Psych  Headaches,    GI/Hepatic   Endo/Other  Morbid obesity  Renal/GU      Musculoskeletal   Abdominal   Peds  Hematology   Anesthesia Other Findings   Reproductive/Obstetrics                             Anesthesia Physical Anesthesia Plan  ASA: II  Anesthesia Plan: General   Post-op Pain Management:    Induction: Intravenous  Airway Management Planned: Oral ETT  Additional Equipment:   Intra-op Plan:   Post-operative Plan: Extubation in OR  Informed Consent: I have reviewed the patients History and Physical, chart, labs and discussed the procedure including the risks, benefits and alternatives for the proposed anesthesia with the patient or authorized representative who has indicated his/her understanding and acceptance.     Plan Discussed with: CRNA, Surgeon and Anesthesiologist  Anesthesia Plan Comments:         Anesthesia Quick Evaluation

## 2015-08-04 ENCOUNTER — Encounter (HOSPITAL_COMMUNITY): Payer: Self-pay | Admitting: Obstetrics and Gynecology

## 2015-08-04 LAB — CBC
HEMATOCRIT: 31.1 % — AB (ref 36.0–46.0)
HEMOGLOBIN: 9.8 g/dL — AB (ref 12.0–15.0)
MCH: 26.9 pg (ref 26.0–34.0)
MCHC: 31.5 g/dL (ref 30.0–36.0)
MCV: 85.4 fL (ref 78.0–100.0)
Platelets: 144 10*3/uL — ABNORMAL LOW (ref 150–400)
RBC: 3.64 MIL/uL — ABNORMAL LOW (ref 3.87–5.11)
RDW: 14.8 % (ref 11.5–15.5)
WBC: 18.1 10*3/uL — AB (ref 4.0–10.5)

## 2015-08-04 MED ORDER — INFLUENZA VAC SPLIT QUAD 0.5 ML IM SUSY
0.5000 mL | PREFILLED_SYRINGE | INTRAMUSCULAR | Status: AC
  Administered 2015-08-05: 0.5 mL via INTRAMUSCULAR

## 2015-08-04 NOTE — Addendum Note (Signed)
Addendum  created 08/04/15 1309 by Randa SpikeMyra D Domanic Matusek, CRNA   Modules edited: Charges VN

## 2015-08-04 NOTE — Progress Notes (Signed)
1 Day Post-Op Procedure(s) (LRB): HYSTERECTOMY ABDOMINAL (N/A) BILATERAL SALPINGO OOPHORECTOMY (Bilateral)  Subjective: Patient reports tolerating PO.    Objective: I have reviewed patient's vital signs, medications and labs.  abd soft + BS Hemoglobin & Hematocrit     Component Value Date/Time   HGB 9.8* 08/04/2015 0530   HCT 31.1* 08/04/2015 0530    Assessment: s/p Procedure(s): HYSTERECTOMY ABDOMINAL (N/A) BILATERAL SALPINGO OOPHORECTOMY (Bilateral): stable  Plan: Advance diet Encourage ambulation Advance to PO medication Discontinue IV fluids  LOS: 1 day    [name] M 08/04/2015, 8:18 AM

## 2015-08-05 MED ORDER — OXYCODONE-ACETAMINOPHEN 5-325 MG PO TABS: 1.0000 | ORAL_TABLET | ORAL | Status: DC | PRN

## 2015-08-05 MED ORDER — IBUPROFEN 800 MG PO TABS: 800.0000 mg | ORAL_TABLET | Freq: Three times a day (TID) | ORAL | Status: DC | PRN

## 2015-08-05 MED ORDER — ESTRADIOL 0.1 MG/24HR TD PTTW: 1.0000 | MEDICATED_PATCH | TRANSDERMAL | Status: DC

## 2015-08-05 MED ORDER — ESTRADIOL 0.1 MG/24HR TD PTWK
0.1000 mg | MEDICATED_PATCH | Freq: Once | TRANSDERMAL | Status: DC
  Administered 2015-08-05: 0.1 mg via TRANSDERMAL
  Filled 2015-08-05: qty 1

## 2015-08-05 NOTE — Discharge Summary (Signed)
Physician Discharge Summary  Patient ID: Candace DubonnetJennifer A Marsee MRN: [id number] DOB/AGE: [birth year] 35 y.o.  Admit date: 08/03/2015 Discharge date: 08/05/2015  Admission Diagnoses:  Discharge Diagnoses:  Active Problems:   Pelvic pain in female   Discharged Condition: good  Hospital Course: adm for TAHBSO fpor pelvic pain and endometriosis, diet advanced on POD 1, and on POD 2, afeb, tol PO and ready for D/C  Consults: None  Significant Diagnostic Studies: labs:  CBC    Component Value Date/Time   WBC 18.1* 08/04/2015 0530   RBC 3.64* 08/04/2015 0530   HGB 9.8* 08/04/2015 0530   HCT 31.1* 08/04/2015 0530   PLT 144* 08/04/2015 0530   MCV 85.4 08/04/2015 0530   MCH 26.9 08/04/2015 0530   MCHC 31.5 08/04/2015 0530   RDW 14.8 08/04/2015 0530   LYMPHSABS 2.9 06/08/2015 1820   MONOABS 1.0 06/08/2015 1820   EOSABS 0.1 06/08/2015 1820   BASOSABS 0.0 06/08/2015 1820      Treatments: surgery: TAHBSO  Discharge Exam: Blood pressure 90/41, pulse 73, temperature 98.1 F (36.7 C), temperature source Oral, resp. rate 16, height 5\' 6"  (1.676 m), weight 282 lb (127.914 kg), last menstrual period 07/21/2015, SpO2 96 %. General appearance: alert GI: abd soft + BS, Inc C/D  Disposition: 01-Home or Self Care     Medication List    STOP taking these medications        HYDROcodone-acetaminophen 5-325 MG tablet  Commonly known as:  NORCO/VICODIN     traMADol 50 MG tablet  Commonly known as:  ULTRAM      TAKE these medications        ibuprofen 800 MG tablet  Commonly known as:  ADVIL,MOTRIN  Take 1 tablet (800 mg total) by mouth every 8 (eight) hours as needed for moderate pain (mild pain).     oxyCODONE-acetaminophen 5-325 MG tablet  Commonly known as:  PERCOCET/ROXICET  Take 1-2 tablets by mouth every 4 (four) hours as needed for severe pain (moderate to severe pain (when tolerating fluids)).           Follow-up Information    Follow up with Meriel PicaHOLLAND,Robbie Nangle M, MD.  Schedule an appointment as soon as possible for a visit in 1 week.   Specialty:  Obstetrics and Gynecology   Contact information:   [address] [place] [id number] [phone]       Signed: Meriel PicaHOLLAND,Broox Lonigro M 08/05/2015, 7:48 AM

## 2015-08-05 NOTE — Progress Notes (Signed)

## 2016-10-24 ENCOUNTER — Emergency Department (HOSPITAL_BASED_OUTPATIENT_CLINIC_OR_DEPARTMENT_OTHER): Payer: BLUE CROSS/BLUE SHIELD

## 2016-10-24 ENCOUNTER — Emergency Department (HOSPITAL_BASED_OUTPATIENT_CLINIC_OR_DEPARTMENT_OTHER)
Admission: EM | Admit: 2016-10-24 | Discharge: 2016-10-24 | Disposition: A | Discharge status: Home / Self Care | Payer: BLUE CROSS/BLUE SHIELD | Attending: Emergency Medicine | Admitting: Emergency Medicine

## 2016-10-24 ENCOUNTER — Encounter (HOSPITAL_BASED_OUTPATIENT_CLINIC_OR_DEPARTMENT_OTHER): Payer: Self-pay

## 2016-10-24 DIAGNOSIS — S3992XA Unspecified injury of lower back, initial encounter: Secondary | ICD-10-CM | POA: Diagnosis present

## 2016-10-24 DIAGNOSIS — M545 Low back pain, unspecified: Secondary | ICD-10-CM

## 2016-10-24 DIAGNOSIS — X58XXXA Exposure to other specified factors, initial encounter: Secondary | ICD-10-CM | POA: Diagnosis not present

## 2016-10-24 DIAGNOSIS — Y999 Unspecified external cause status: Secondary | ICD-10-CM | POA: Insufficient documentation

## 2016-10-24 DIAGNOSIS — R109 Unspecified abdominal pain: Secondary | ICD-10-CM | POA: Diagnosis not present

## 2016-10-24 DIAGNOSIS — Y929 Unspecified place or not applicable: Secondary | ICD-10-CM | POA: Diagnosis not present

## 2016-10-24 DIAGNOSIS — S39012A Strain of muscle, fascia and tendon of lower back, initial encounter: Secondary | ICD-10-CM

## 2016-10-24 DIAGNOSIS — I10 Essential (primary) hypertension: Secondary | ICD-10-CM | POA: Insufficient documentation

## 2016-10-24 DIAGNOSIS — Y939 Activity, unspecified: Secondary | ICD-10-CM | POA: Diagnosis not present

## 2016-10-24 DIAGNOSIS — R110 Nausea: Secondary | ICD-10-CM | POA: Diagnosis not present

## 2016-10-24 DIAGNOSIS — J45909 Unspecified asthma, uncomplicated: Secondary | ICD-10-CM | POA: Diagnosis not present

## 2016-10-24 DIAGNOSIS — Z7984 Long term (current) use of oral hypoglycemic drugs: Secondary | ICD-10-CM | POA: Diagnosis not present

## 2016-10-24 HISTORY — DX: Prediabetes: R73.03

## 2016-10-24 LAB — CBC WITH DIFFERENTIAL/PLATELET
Basophils Absolute: 0 10*3/uL (ref 0.0–0.1)
Basophils Relative: 0 %
EOS ABS: 0.1 10*3/uL (ref 0.0–0.7)
Eosinophils Relative: 1 %
HEMATOCRIT: 38.4 % (ref 36.0–46.0)
HEMOGLOBIN: 12.7 g/dL (ref 12.0–15.0)
LYMPHS ABS: 2.7 10*3/uL (ref 0.7–4.0)
Lymphocytes Relative: 23 %
MCH: 28.5 pg (ref 26.0–34.0)
MCHC: 33.1 g/dL (ref 30.0–36.0)
MCV: 86.1 fL (ref 78.0–100.0)
Monocytes Absolute: 0.9 10*3/uL (ref 0.1–1.0)
Monocytes Relative: 8 %
NEUTROS ABS: 7.9 10*3/uL — AB (ref 1.7–7.7)
Neutrophils Relative %: 68 %
Platelets: 203 10*3/uL (ref 150–400)
RBC: 4.46 MIL/uL (ref 3.87–5.11)
RDW: 14.5 % (ref 11.5–15.5)
WBC: 11.6 10*3/uL — ABNORMAL HIGH (ref 4.0–10.5)

## 2016-10-24 LAB — COMPREHENSIVE METABOLIC PANEL
ALK PHOS: 70 U/L (ref 38–126)
ALT: 24 U/L (ref 14–54)
ANION GAP: 9 (ref 5–15)
AST: 23 U/L (ref 15–41)
Albumin: 3.6 g/dL (ref 3.5–5.0)
BILIRUBIN TOTAL: 1.1 mg/dL (ref 0.3–1.2)
BUN: 10 mg/dL (ref 6–20)
CO2: 25 mmol/L (ref 22–32)
Calcium: 8.4 mg/dL — ABNORMAL LOW (ref 8.9–10.3)
Chloride: 103 mmol/L (ref 101–111)
Creatinine, Ser: 0.77 mg/dL (ref 0.44–1.00)
GFR calc non Af Amer: >60 mL/min (ref >60)
Glucose, Bld: 94 mg/dL (ref 65–99)
Potassium: 3.5 mmol/L (ref 3.5–5.1)
Sodium: 137 mmol/L (ref 135–145)
TOTAL PROTEIN: 6.8 g/dL (ref 6.5–8.1)

## 2016-10-24 LAB — URINALYSIS, ROUTINE W REFLEX MICROSCOPIC
BILIRUBIN URINE: NEGATIVE
GLUCOSE, UA: NEGATIVE mg/dL
Hgb urine dipstick: NEGATIVE
Ketones, ur: 15 mg/dL — AB
Leukocytes, UA: NEGATIVE
NITRITE: NEGATIVE
PH: 7 (ref 5.0–8.0)
Protein, ur: NEGATIVE mg/dL
SPECIFIC GRAVITY, URINE: 1.012 (ref 1.005–1.030)

## 2016-10-24 MED ORDER — METHOCARBAMOL 500 MG PO TABS: 500.0000 mg | ORAL_TABLET | Freq: Four times a day (QID) | ORAL | 0 refills | Status: DC

## 2016-10-24 MED ORDER — SODIUM CHLORIDE 0.9 % IV BOLUS (SEPSIS): 1000.0000 mL | Freq: Once | INTRAVENOUS | Status: DC

## 2016-10-24 MED ORDER — MORPHINE SULFATE (PF) 4 MG/ML IV SOLN: 4.0000 mg | Freq: Once | INTRAVENOUS | Status: DC

## 2016-10-24 MED ORDER — MORPHINE SULFATE (PF) 4 MG/ML IV SOLN
4.0000 mg | Freq: Once | INTRAVENOUS | Status: AC
  Administered 2016-10-24: 4 mg via INTRAMUSCULAR
  Filled 2016-10-24: qty 1

## 2016-10-24 MED ORDER — ONDANSETRON 4 MG PO TBDP
4.0000 mg | ORAL_TABLET | Freq: Once | ORAL | Status: AC
  Administered 2016-10-24: 4 mg via ORAL
  Filled 2016-10-24: qty 1

## 2016-10-24 MED ORDER — ONDANSETRON HCL 4 MG/2ML IJ SOLN: 4.0000 mg | Freq: Once | INTRAMUSCULAR | Status: DC

## 2016-10-24 MED ORDER — ONDANSETRON 4 MG PO TBDP: 4.0000 mg | ORAL_TABLET | Freq: Three times a day (TID) | ORAL | 0 refills | Status: DC | PRN

## 2016-10-24 MED ORDER — IBUPROFEN 600 MG PO TABS: 600.0000 mg | ORAL_TABLET | Freq: Four times a day (QID) | ORAL | 0 refills | Status: DC | PRN

## 2016-10-24 NOTE — Discharge Instructions (Signed)
Please read and follow all provided instructions.  Your diagnoses today include:  1. Acute right-sided low back pain without sciatica   2. Strain of lumbar region, initial encounter     Tests performed today include: Vital signs - see below for your results today  Medications prescribed:   Take any prescribed medications only as directed.  Home care instructions:  Follow any educational materials contained in this packet Please rest, use ice or heat on your back for the next several days Do not lift, push, pull anything more than 10 pounds for the next week  Follow-up instructions: Please follow-up with your primary care provider in the next 1 week for further evaluation of your symptoms.   Return instructions:  SEEK IMMEDIATE MEDICAL ATTENTION IF YOU HAVE: New numbness, tingling, weakness, or problem with the use of your arms or legs Severe back pain not relieved with medications Loss control of your bowels or bladder Increasing pain in any areas of the body (such as chest or abdominal pain) Shortness of breath, dizziness, or fainting.  Worsening nausea (feeling sick to your stomach), vomiting, fever, or sweats Any other emergent concerns regarding your health   Additional Information:  Your vital signs today were: BP (!) 106/55 (BP Location: Right Wrist)    Pulse 96    Temp 98.3 F (36.8 C) (Oral)    Resp 20    Ht 5\' 6"  (1.676 m)    Wt 133 kg (293 lb 2 oz)    LMP 07/21/2015 (Exact Date)    SpO2 100%    BMI 47.31 kg/m  If your blood pressure (BP) was elevated above 135/85 this visit, please have this repeated by your doctor within one month. --------------

## 2016-10-24 NOTE — ED Triage Notes (Signed)
C/o right flank pain since 2pm-nausea-denies urinary sx-NAD-steady gait

## 2016-10-24 NOTE — ED Provider Notes (Signed)
[facility] Provider Note   CSN: [id number] Arrival date & time: 10/24/16  2008  By signing my name below, I, [name], attest that this documentation has been prepared under the direction and in the presence of [name], PA-C. Electronically Signed: [name], ED Scribe. 10/24/16. 10:06 PM.  History   Chief Complaint Chief Complaint  Patient presents with  . Flank Pain   The history is provided by the patient. No language interpreter was used.    HPI Comments: Candace DubonnetJennifer A Young is a 36 y.o. female with a hx of nephrolithiasis who presents to the Emergency Department complaining of sudden onset, intermittent sharp right flank pain that started @ 2 pm today. She notes associated nausea. Pt reports around 2 pm today she began to feel sharp right flank pain and throughtout the day the pain has progressed and become more persistent. She notes if she is able to fine a comfortable position her pain is tolerable. Her pain is exacerbated with movement. Denies recent traumatic injury to the area. No treatment tried PTA. Denies fever, abdominal pain, dysuria, hematuria, chest pain, shortness or breath, or vomiting.    Past Medical History:  Diagnosis Date  . Allergy-induced asthma   . Endometriosis   . Endometriosis   . Headache    occasional migraines  . Hypertension 01/2002   with pregnancy  . Kidney stones   . Pneumonia    2008  . PONV (postoperative nausea and vomiting)    nausea after c-section  . Prediabetes     Patient Active Problem List   Diagnosis Date Noted  . Pelvic pain in female 08/03/2015    Past Surgical History:  Procedure Laterality Date  . ABDOMINAL HYSTERECTOMY N/A 08/03/2015   Procedure: HYSTERECTOMY ABDOMINAL;  Surgeon: Richarda Overlieichard Holland, MD;  Location: [facility];  Service: Gynecology;  Laterality: N/A;  . CESAREAN SECTION     x2  . CHOLECYSTECTOMY    . KIDNEY STONE SURGERY    . LAPAROSCOPY  age 89  . laser treatment for endometriosis  age  36  . SALPINGOOPHORECTOMY Bilateral 08/03/2015   Procedure: BILATERAL SALPINGO OOPHORECTOMY;  Surgeon: Richarda Overlieichard Holland, MD;  Location: [facility];  Service: Gynecology;  Laterality: Bilateral;    OB History    No data available       Home Medications    Prior to Admission medications   Medication Sig Start Date End Date Taking? Authorizing Provider  METFORMIN HCL PO Take by mouth.   Yes [provider]    Family History No family history on file.  Social History Social History  Substance Use Topics  . Smoking status: Never Smoker  . Smokeless tobacco: Never Used  . Alcohol use Yes     Comment: occasional     Allergies   Latex and Sulfa antibiotics   Review of Systems Review of Systems  Constitutional: Negative for fever.  Respiratory: Negative for shortness of breath.   Cardiovascular: Negative for chest pain.  Gastrointestinal: Positive for nausea. Negative for abdominal pain and vomiting.  Genitourinary: Negative for dysuria and hematuria.    A complete 10 system review of systems was obtained and all systems are negative except as noted in the HPI and PMH.   Physical Exam Updated Vital Signs BP 130/88 (BP Location: Left Arm)   Pulse 86   Temp 98.3 F (36.8 C) (Oral)   Resp (!) 22   Ht 5\' 6"  (1.676 m)   Wt 293 lb 2 oz (133 kg)  LMP 07/21/2015 (Exact Date)   SpO2 100%   BMI 47.31 kg/m   Physical Exam  Constitutional: She is oriented to person, place, and time. Vital signs are normal. She appears well-developed and well-nourished.  HENT:  Head: Normocephalic and atraumatic.  Right Ear: Hearing normal.  Left Ear: Hearing normal.  Eyes: Conjunctivae and EOM are normal. Pupils are equal, round, and reactive to light.  Neck: Normal range of motion. Neck supple.  Cardiovascular: Normal rate, regular rhythm, normal heart sounds and intact distal pulses.   Pulmonary/Chest: Effort normal and breath sounds normal.  Abdominal: Soft. Normal appearance.  She exhibits no distension. There is no tenderness. There is CVA tenderness (right). There is no rigidity, no rebound, no guarding, no tenderness at McBurney's point and negative Murphy's sign.  Musculoskeletal: Normal range of motion.  CVA tenderness to the right flank. No midline spinal process tenderness.   Neurological: She is alert and oriented to person, place, and time.  Skin: Skin is warm and dry.  Psychiatric: She has a normal mood and affect. Her speech is normal and behavior is normal. Thought content normal.  Nursing note and vitals reviewed.  ED Treatments / Results  DIAGNOSTIC STUDIES: Oxygen Saturation is 100% on RA, normal by my interpretation.   COORDINATION OF CARE: 9:54 PM-Discussed next steps with pt. Pt verbalized understanding and is agreeable with the plan.   Labs (all labs ordered are listed, but only abnormal results are displayed) Labs Reviewed  URINALYSIS, ROUTINE W REFLEX MICROSCOPIC - Abnormal; Notable for the following:       Result Value   Ketones, ur 15 (*)    All other components within normal limits  CBC WITH DIFFERENTIAL/PLATELET - Abnormal; Notable for the following:    WBC 11.6 (*)    Neutro Abs 7.9 (*)    All other components within normal limits  COMPREHENSIVE METABOLIC PANEL - Abnormal; Notable for the following:    Calcium 8.4 (*)    All other components within normal limits  I-STAT CHEM 8, ED    EKG  EKG Interpretation None       Radiology Ct Renal Stone Study  Result Date: 10/24/2016 CLINICAL DATA:  Acute onset of right flank pain.  Initial encounter. EXAM: CT ABDOMEN AND PELVIS WITHOUT CONTRAST TECHNIQUE: Multidetector CT imaging of the abdomen and pelvis was performed following the standard protocol without IV contrast. COMPARISON:  CT of the abdomen and pelvis from 06/08/2015 FINDINGS: Lower chest: The visualized lung bases are grossly clear. The visualized portions of the mediastinum are unremarkable. Hepatobiliary: The liver  is unremarkable in appearance. The patient is status post cholecystectomy, with clips noted at the gallbladder fossa. The common bile duct remains normal in caliber. Pancreas: The pancreas is within normal limits. Spleen: The spleen is mildly enlarged, measuring 13.5 cm in length. Adrenals/Urinary Tract: The adrenal glands are unremarkable in appearance. The kidneys are within normal limits. There is no evidence of hydronephrosis. No renal or ureteral stones are identified. No perinephric stranding is seen. Stomach/Bowel: The stomach is unremarkable in appearance. The small bowel is within normal limits. The appendix is normal in caliber, without evidence of appendicitis. The colon is unremarkable in appearance. Vascular/Lymphatic: The abdominal aorta is unremarkable in appearance. The inferior vena cava is grossly unremarkable. No retroperitoneal lymphadenopathy is seen. No pelvic sidewall lymphadenopathy is identified. Reproductive: The bladder is mildly distended and grossly unremarkable. The patient is status post hysterectomy. No suspicious adnexal masses are seen. Other: No additional soft tissue abnormalities  are seen. Musculoskeletal: No acute osseous abnormalities are identified. The visualized musculature is unremarkable in appearance. IMPRESSION: 1. No acute abnormality seen within the abdomen or pelvis. 2. Mild splenomegaly. Electronically Signed   By: Roanna Raider M.D.   On: 10/24/2016 22:42    Procedures Procedures (including critical care time)  Medications Ordered in ED Medications - No data to display   Initial Impression / Assessment and Plan / ED Course  I have reviewed the triage vital signs and the nursing notes.  Pertinent labs & imaging results that were available during my care of the patient were reviewed by me and considered in my medical decision making (see chart for details).  Final Clinical Impressions(s) / ED Diagnoses  I have reviewed and evaluated the relevant  laboratory values I have reviewed and evaluated the relevant imaging studies.  I have reviewed the relevant previous healthcare records. I obtained HPI from historian.  ED Course:  Assessment: Patient is a 36 y.o. female who presents to the ED with right flank pain today. Notes nausea without emesis. No dysuria. No CP/SOB. No abd pain. No neurological deficits appreciated. Patient is ambulatory. No warning symptoms of back pain including: fecal incontinence, urinary retention or overflow incontinence, night sweats, waking from sleep with back pain, unexplained fevers or weight loss, h/o cancer, IVDU, recent trauma. No concern for cauda equina, epidural abscess, or other serious cause of back pain. CT renal negative. Labs unremarkable. UA negative. Likely musculoskeletal pain. Given Rx Robaxin and follow up to PCP. Conservative measures such as rest, ice/heat and pain medicine indicated with PCP follow-up if no improvement with conservative management.   Disposition/Plan:  DC Home Additional Verbal discharge instructions given and discussed with patient.  Pt Instructed to f/u with PCP in the next week for evaluation and treatment of symptoms. Return precautions given Pt acknowledges and agrees with plan  Supervising Physician Rolan Bucco, MD   Final diagnoses:  Acute right-sided low back pain without sciatica  Strain of lumbar region, initial encounter    New Prescriptions New Prescriptions   No medications on file   I personally performed the services described in this documentation, which was scribed in my presence. The recorded information has been reviewed and is accurate.    Audry Pili, PA-C 10/24/16 2321    Rolan Bucco, MD 10/24/16 2325

## 2016-10-24 NOTE — ED Notes (Signed)
Unable to obtain IV access x4 attempts by 2 staff members. Notified PA and orders modified accordingly.

## 2020-11-02 ENCOUNTER — Emergency Department (HOSPITAL_COMMUNITY)
Admission: EM | Admit: 2020-11-02 | Discharge: 2020-11-02 | Disposition: A | Discharge status: Home / Self Care | Payer: 59 | Attending: Emergency Medicine | Admitting: Emergency Medicine

## 2020-11-02 ENCOUNTER — Emergency Department (HOSPITAL_COMMUNITY): Payer: 59

## 2020-11-02 ENCOUNTER — Encounter (HOSPITAL_COMMUNITY): Payer: Self-pay

## 2020-11-02 DIAGNOSIS — W010XXA Fall on same level from slipping, tripping and stumbling without subsequent striking against object, initial encounter: Secondary | ICD-10-CM | POA: Insufficient documentation

## 2020-11-02 DIAGNOSIS — Z7984 Long term (current) use of oral hypoglycemic drugs: Secondary | ICD-10-CM | POA: Diagnosis not present

## 2020-11-02 DIAGNOSIS — M79652 Pain in left thigh: Secondary | ICD-10-CM | POA: Insufficient documentation

## 2020-11-02 DIAGNOSIS — Z91040 Latex allergy status: Secondary | ICD-10-CM | POA: Diagnosis not present

## 2020-11-02 DIAGNOSIS — S7002XA Contusion of left hip, initial encounter: Secondary | ICD-10-CM

## 2020-11-02 DIAGNOSIS — S73102A Unspecified sprain of left hip, initial encounter: Secondary | ICD-10-CM

## 2020-11-02 DIAGNOSIS — I10 Essential (primary) hypertension: Secondary | ICD-10-CM | POA: Diagnosis not present

## 2020-11-02 DIAGNOSIS — R52 Pain, unspecified: Secondary | ICD-10-CM | POA: Diagnosis not present

## 2020-11-02 DIAGNOSIS — S79929A Unspecified injury of unspecified thigh, initial encounter: Secondary | ICD-10-CM | POA: Diagnosis not present

## 2020-11-02 DIAGNOSIS — M25552 Pain in left hip: Secondary | ICD-10-CM | POA: Diagnosis not present

## 2020-11-02 DIAGNOSIS — I959 Hypotension, unspecified: Secondary | ICD-10-CM | POA: Diagnosis not present

## 2020-11-02 DIAGNOSIS — R102 Pelvic and perineal pain: Secondary | ICD-10-CM | POA: Diagnosis not present

## 2020-11-02 MED ORDER — METHOCARBAMOL 750 MG PO TABS: 750.0000 mg | ORAL_TABLET | Freq: Three times a day (TID) | ORAL | 0 refills | Status: DC | PRN

## 2020-11-02 MED ORDER — IBUPROFEN 600 MG PO TABS: 600.0000 mg | ORAL_TABLET | Freq: Three times a day (TID) | ORAL | 0 refills | Status: AC | PRN

## 2020-11-02 MED ORDER — IBUPROFEN 400 MG PO TABS
600.0000 mg | ORAL_TABLET | Freq: Once | ORAL | Status: AC
  Administered 2020-11-02: 600 mg via ORAL
  Filled 2020-11-02: qty 1

## 2020-11-02 MED ORDER — METHOCARBAMOL 500 MG PO TABS
750.0000 mg | ORAL_TABLET | Freq: Once | ORAL | Status: AC
  Administered 2020-11-02: 750 mg via ORAL
  Filled 2020-11-02: qty 2

## 2020-11-02 NOTE — ED Triage Notes (Signed)
Fall with pain right hip/upper leg posterior. Placed in traction by medic and given fentanyl. Pedal pulses intact.

## 2020-11-02 NOTE — ED Notes (Signed)
Walked patient out in the hall way patient did ok a little pain while walking she stated that the pain was a 5 patient is now back in bed on the monitor with call bell in reach with family  at bedside

## 2020-11-02 NOTE — ED Notes (Signed)
ED Provider at bedside. 

## 2020-11-02 NOTE — ED Notes (Signed)
Patient transported to X-ray 

## 2020-11-02 NOTE — Discharge Instructions (Addendum)
It was our pleasure to provide your ER care today - we hope that you feel better.  Icepack/cold to sore area.   Take ibuprofen as need for pain. You may also take robaxin as need for muscle pain/spasm - no driving when taking.   Follow up with primary care doctor in 1 week if symptoms fail to improve/resolve.  Return to ER if worse, new, severe or intractable pain, numbness/weakness, or other concern.  You were given pain medication in the ER - no driving for the next 6 hours.

## 2020-11-02 NOTE — ED Provider Notes (Signed)
[facility] EMERGENCY DEPARTMENT Provider Note   CSN: [id number] Arrival date & time: 11/02/20  5400     History Chief Complaint  Patient presents with  . Fall    [name] is a 40 y.o. female.  Patient c/o slip and fall with left hip and thigh pain this AM. Symptoms acute onset, moderate, dull, constant, non radiating, worse w movement. States foot sliding/slipping caused fall, no faintness or dizziness prior to fall. No head injury, or loc w fall. No headache. No neck or back pain. Denies other pain or injury. Skin intact. No associated numbness or weakness.   The history is provided by the patient and the EMS personnel.  Fall Pertinent negatives include no chest pain, no abdominal pain, no headaches and no shortness of breath.       Past Medical History:  Diagnosis Date  . Allergy-induced asthma   . Endometriosis   . Endometriosis   . Headache    occasional migraines  . Hypertension 01/2002   with pregnancy  . Kidney stones   . Pneumonia    2008  . PONV (postoperative nausea and vomiting)    nausea after c-section  . Prediabetes     Patient Active Problem List   Diagnosis Date Noted  . Pelvic pain in female 08/03/2015    Past Surgical History:  Procedure Laterality Date  . ABDOMINAL HYSTERECTOMY N/A 08/03/2015   Procedure: HYSTERECTOMY ABDOMINAL;  Surgeon: Richarda Overlie, MD;  Location: [facility];  Service: Gynecology;  Laterality: N/A;  . CESAREAN SECTION     x2  . CHOLECYSTECTOMY    . KIDNEY STONE SURGERY    . LAPAROSCOPY  age 29  . laser treatment for endometriosis  age 75  . SALPINGOOPHORECTOMY Bilateral 08/03/2015   Procedure: BILATERAL SALPINGO OOPHORECTOMY;  Surgeon: Richarda Overlie, MD;  Location: [facility];  Service: Gynecology;  Laterality: Bilateral;     OB History   No obstetric history on file.     No family history on file.  Social History   Tobacco Use  . Smoking status: Never Smoker  . Smokeless tobacco: Never  Used  Substance Use Topics  . Alcohol use: Yes    Comment: occasional  . Drug use: No    Home Medications Prior to Admission medications   Medication Sig Start Date End Date Taking? Authorizing Provider  ibuprofen (ADVIL,MOTRIN) 600 MG tablet Take 1 tablet (600 mg total) by mouth every 6 (six) hours as needed. 10/24/16   Audry Pili, PA-C  METFORMIN HCL PO Take by mouth.    [provider]  methocarbamol (ROBAXIN) 500 MG tablet Take 1 tablet (500 mg total) by mouth 4 (four) times daily. 10/24/16   Audry Pili, PA-C  ondansetron (ZOFRAN ODT) 4 MG disintegrating tablet Take 1 tablet (4 mg total) by mouth every 8 (eight) hours as needed for nausea or vomiting. 10/24/16   Audry Pili, PA-C    Allergies    Latex and Sulfa antibiotics  Review of Systems   Review of Systems  Constitutional: Negative for fever.  HENT: Negative for nosebleeds.   Eyes: Negative for pain and visual disturbance.  Respiratory: Negative for shortness of breath.   Cardiovascular: Negative for chest pain.  Gastrointestinal: Negative for abdominal pain, nausea and vomiting.  Genitourinary: Negative for flank pain.  Musculoskeletal: Negative for back pain and neck pain.  Skin: Negative for wound.  Neurological: Negative for weakness, numbness and headaches.  Hematological: Does not bruise/bleed easily.  Psychiatric/Behavioral: Negative  for confusion.    Physical Exam Updated Vital Signs BP 129/79 (BP Location: Right Arm)   Pulse 67   Temp 97.8 F (36.6 C) (Oral)   Ht 1.676 m (5\' 6" )   Wt (!) 140.6 kg   LMP 07/21/2015 (Exact Date)   SpO2 99%   BMI 50.04 kg/m   Physical Exam Vitals and nursing note reviewed.  Constitutional:      Appearance: Normal appearance. She is well-developed.  HENT:     Head: Atraumatic.     Nose: Nose normal.     Mouth/Throat:     Mouth: Mucous membranes are moist.  Eyes:     General: No scleral icterus.    Conjunctiva/sclera: Conjunctivae normal.  Neck:      Trachea: No tracheal deviation.  Cardiovascular:     Rate and Rhythm: Normal rate and regular rhythm.     Pulses: Normal pulses.     Heart sounds: Normal heart sounds. No murmur heard. No friction rub. No gallop.   Pulmonary:     Effort: Pulmonary effort is normal. No respiratory distress.     Breath sounds: Normal breath sounds.  Abdominal:     General: Bowel sounds are normal. There is no distension.     Palpations: Abdomen is soft.     Tenderness: There is no abdominal tenderness. There is no guarding.  Genitourinary:    Comments: No cva tenderness.  Musculoskeletal:        General: No swelling.     Cervical back: Normal range of motion and neck supple. No rigidity or tenderness. No muscular tenderness.     Comments: CTLS spine, non tender, aligned, no step off. Tenderness left hip and thigh area, otherwise good rom  bil extremities without pain or other focal bony tenderness. Skin intact. Distal pulses palp bil.   Skin:    General: Skin is warm and dry.     Findings: No rash.  Neurological:     Mental Status: She is alert.     Comments: Alert, speech normal. GCS 15. Motor intact bil, stre 5/5. Sens grossly intact.   Psychiatric:        Mood and Affect: Mood normal.     ED Results / Procedures / Treatments   Labs (all labs ordered are listed, but only abnormal results are displayed) Labs Reviewed - No data to display  EKG None  Radiology DG Pelvis 1-2 Views  Result Date: 11/02/2020 CLINICAL DATA:  Pain following fall EXAM: PELVIS - 1-2 VIEW COMPARISON:  None. FINDINGS: There is no evidence of pelvic fracture or dislocation. Joint spaces appear normal. No erosive change. IMPRESSION: No fracture or dislocation.  No appreciable arthropathy. Electronically Signed   By: 01/02/2021 III M.D.   On: 11/02/2020 08:30   DG Femur Min 2 Views Left  Result Date: 11/02/2020 CLINICAL DATA:  Pain following fall EXAM: LEFT FEMUR 2 VIEWS COMPARISON:  None. FINDINGS: Frontal and  lateral views were obtained. No fracture or dislocation. The joint spaces appear normal. No erosive change. IMPRESSION: No fracture or dislocation.  No evident arthropathy. Electronically Signed   By: 01/02/2021 III M.D.   On: 11/02/2020 08:30    Procedures Procedures   Medications Ordered in ED Medications - No data to display  ED Course  I have reviewed the triage vital signs and the nursing notes.  Pertinent labs & imaging results that were available during my care of the patient were reviewed by me and considered in my medical  decision making (see chart for details).    MDM Rules/Calculators/A&P                          Imaging ordered. EMS gave fentanyl iv pta.  Reviewed nursing notes and prior charts for additional history.   Xrays reviewed/interpreted by me - no fx.   Robaxin po, ibuprofen po.   Ambulate w assist in hall.  Discussed xrays w pt.   Pt currently appears stable for d/c.      Final Clinical Impression(s) / ED Diagnoses Final diagnoses:  Pain    Rx / DC Orders ED Discharge Orders    None       Cathren Laine, MD 11/02/20 1007

## 2020-11-05 DIAGNOSIS — R200 Anesthesia of skin: Secondary | ICD-10-CM | POA: Diagnosis not present

## 2020-11-05 DIAGNOSIS — M79605 Pain in left leg: Secondary | ICD-10-CM | POA: Diagnosis not present

## 2020-11-28 DIAGNOSIS — H5203 Hypermetropia, bilateral: Secondary | ICD-10-CM | POA: Diagnosis not present

## 2021-01-25 DIAGNOSIS — M79672 Pain in left foot: Secondary | ICD-10-CM | POA: Diagnosis not present

## 2021-01-25 DIAGNOSIS — M542 Cervicalgia: Secondary | ICD-10-CM | POA: Diagnosis not present

## 2021-01-25 DIAGNOSIS — H538 Other visual disturbances: Secondary | ICD-10-CM | POA: Diagnosis not present

## 2021-01-25 DIAGNOSIS — R631 Polydipsia: Secondary | ICD-10-CM | POA: Diagnosis not present

## 2021-01-25 DIAGNOSIS — R519 Headache, unspecified: Secondary | ICD-10-CM | POA: Diagnosis not present

## 2021-01-25 DIAGNOSIS — R109 Unspecified abdominal pain: Secondary | ICD-10-CM | POA: Diagnosis not present

## 2021-01-25 DIAGNOSIS — E8940 Asymptomatic postprocedural ovarian failure: Secondary | ICD-10-CM | POA: Diagnosis not present

## 2021-01-27 ENCOUNTER — Other Ambulatory Visit (HOSPITAL_COMMUNITY): Payer: Self-pay

## 2021-01-27 MED ORDER — ESTRADIOL 2 MG PO TABS
2.0000 mg | ORAL_TABLET | Freq: Every day | ORAL | 0 refills | Status: DC
  Filled 2021-01-27: qty 30, 30d supply, fill #0

## 2021-02-22 ENCOUNTER — Other Ambulatory Visit: Payer: Self-pay

## 2021-02-22 ENCOUNTER — Ambulatory Visit (INDEPENDENT_AMBULATORY_CARE_PROVIDER_SITE_OTHER): Payer: 59

## 2021-02-22 ENCOUNTER — Ambulatory Visit
Admission: RE | Admit: 2021-02-22 | Discharge: 2021-02-22 | Disposition: A | Discharge status: Home / Self Care | Payer: 59 | Source: Ambulatory Visit | Attending: Urgent Care | Admitting: Urgent Care

## 2021-02-22 VITALS — BP 136/83 | HR 83 | Temp 97.8°F | Ht 66.0 in | Wt 320.0 lb

## 2021-02-22 DIAGNOSIS — R0989 Other specified symptoms and signs involving the circulatory and respiratory systems: Secondary | ICD-10-CM | POA: Diagnosis not present

## 2021-02-22 DIAGNOSIS — R059 Cough, unspecified: Secondary | ICD-10-CM

## 2021-02-22 DIAGNOSIS — B9789 Other viral agents as the cause of diseases classified elsewhere: Secondary | ICD-10-CM

## 2021-02-22 DIAGNOSIS — J988 Other specified respiratory disorders: Secondary | ICD-10-CM

## 2021-02-22 DIAGNOSIS — J4520 Mild intermittent asthma, uncomplicated: Secondary | ICD-10-CM

## 2021-02-22 MED ORDER — PREDNISONE 20 MG PO TABS: ORAL_TABLET | ORAL | 0 refills | Status: DC

## 2021-02-22 MED ORDER — BENZONATATE 100 MG PO CAPS: 100.0000 mg | ORAL_CAPSULE | Freq: Three times a day (TID) | ORAL | 0 refills | Status: DC | PRN

## 2021-02-22 MED ORDER — CETIRIZINE HCL 10 MG PO TABS: 10.0000 mg | ORAL_TABLET | Freq: Every day | ORAL | 0 refills | Status: DC

## 2021-02-22 MED ORDER — PROMETHAZINE-DM 6.25-15 MG/5ML PO SYRP: 5.0000 mL | ORAL_SOLUTION | Freq: Every evening | ORAL | 0 refills | Status: DC | PRN

## 2021-02-22 NOTE — ED Provider Notes (Signed)
[facility]   MRN: [id number] DOB: 07-21-1980  Subjective:   [name] is a 40 y.o. female presenting for 2-day history of acute onset runny and stuffy nose, ear pain, dry cough, chest congestion.  Patient has a history of pneumonia.  States that this feels the same.  She is COVID vaccinated.  Has been using multiple over-the-counter medications without any relief.  She does have a history of asthma but has not needed an inhaler in a while.  No smoking.  No current facility-administered medications for this encounter.  Current Outpatient Medications:    estradiol (ESTRACE) 2 MG tablet, Take 1 tablet (2 mg total) by mouth daily., Disp: 60 tablet, Rfl: 0   ibuprofen (ADVIL) 600 MG tablet, Take 1 tablet (600 mg total) by mouth every 8 (eight) hours as needed. Take with food., Disp: 15 tablet, Rfl: 0   METFORMIN HCL PO, Take by mouth., Disp: , Rfl:    methocarbamol (ROBAXIN) 750 MG tablet, Take 1 tablet (750 mg total) by mouth 3 (three) times daily as needed (muscle spasm/pain)., Disp: 15 tablet, Rfl: 0   ondansetron (ZOFRAN ODT) 4 MG disintegrating tablet, Take 1 tablet (4 mg total) by mouth every 8 (eight) hours as needed for nausea or vomiting., Disp: 20 tablet, Rfl: 0   Allergies  Allergen Reactions   Latex Rash   Sulfa Antibiotics Rash    Past Medical History:  Diagnosis Date   Allergy-induced asthma    Endometriosis    Endometriosis    Headache    occasional migraines   Hypertension 01/2002   with pregnancy   Kidney stones    Pneumonia    2008   PONV (postoperative nausea and vomiting)    nausea after c-section   Prediabetes      Past Surgical History:  Procedure Laterality Date   ABDOMINAL HYSTERECTOMY N/A 08/03/2015   Procedure: HYSTERECTOMY ABDOMINAL;  Surgeon: Richarda Overlie, MD;  Location: [facility];  Service: Gynecology;  Laterality: N/A;   CESAREAN SECTION     x2   CHOLECYSTECTOMY     KIDNEY STONE SURGERY     LAPAROSCOPY  age 5   laser  treatment for endometriosis  age 26   SALPINGOOPHORECTOMY Bilateral 08/03/2015   Procedure: BILATERAL SALPINGO OOPHORECTOMY;  Surgeon: Richarda Overlie, MD;  Location: [facility];  Service: Gynecology;  Laterality: Bilateral;    No family history on file.  Social History   Tobacco Use   Smoking status: Never   Smokeless tobacco: Never  Substance Use Topics   Alcohol use: Yes    Comment: occasional   Drug use: No    ROS   Objective:   Vitals: BP 136/83 (BP Location: Right Arm)   Pulse 83   Temp 97.8 F (36.6 C) (Oral)   Ht 5\' 6"  (1.676 m)   Wt (!) 320 lb (145.2 kg)   LMP 07/21/2015 (Exact Date)   SpO2 95%   BMI 51.65 kg/m   Physical Exam Constitutional:      General: She is not in acute distress.    Appearance: Normal appearance. She is well-developed. She is ill-appearing. She is not toxic-appearing or diaphoretic.  HENT:     Head: Normocephalic and atraumatic.     Right Ear: Tympanic membrane and ear canal normal. No drainage or tenderness. No middle ear effusion. Tympanic membrane is not erythematous.     Left Ear: Tympanic membrane and ear canal normal. No drainage or tenderness.  No middle ear effusion. Tympanic membrane is  not erythematous.     Nose: Congestion present. No rhinorrhea.     Mouth/Throat:     Mouth: Mucous membranes are moist. No oral lesions.     Pharynx: No pharyngeal swelling, oropharyngeal exudate, posterior oropharyngeal erythema or uvula swelling.     Tonsils: No tonsillar exudate or tonsillar abscesses.  Eyes:     Extraocular Movements: Extraocular movements intact.     Right eye: Normal extraocular motion.     Left eye: Normal extraocular motion.     Conjunctiva/sclera: Conjunctivae normal.     Pupils: Pupils are equal, round, and reactive to light.  Cardiovascular:     Rate and Rhythm: Normal rate and regular rhythm.     Pulses: Normal pulses.     Heart sounds: Normal heart sounds. No murmur heard.   No friction rub. No gallop.   Pulmonary:     Effort: Pulmonary effort is normal. No respiratory distress.     Breath sounds: No stridor. No wheezing, rhonchi or rales.     Comments: Decreased lung sounds and mid to lower lung fields. Musculoskeletal:     Cervical back: Normal range of motion and neck supple.  Lymphadenopathy:     Cervical: No cervical adenopathy.  Skin:    General: Skin is warm and dry.     Findings: No rash.  Neurological:     General: No focal deficit present.     Mental Status: She is alert and oriented to person, place, and time.  Psychiatric:        Mood and Affect: Mood normal.        Behavior: Behavior normal.        Thought Content: Thought content normal.    DG Chest 2 View  Result Date: 02/22/2021 CLINICAL DATA:  Congestion EXAM: CHEST - 2 VIEW COMPARISON:  05/17/2014 FINDINGS: No focal opacity or pleural effusion. Normal cardiomediastinal silhouette. No pneumothorax. Slightly coarse interstitial opacity could be due to interstitial inflammatory process. IMPRESSION: No focal airspace disease seen. Electronically Signed   By: Jasmine Pang M.D.   On: 02/22/2021 16:19     Assessment and Plan :   PDMP not reviewed this encounter.  1. Viral respiratory illness   2. Cough   3. Chest congestion   4. Mild intermittent extrinsic asthma without complication     COVID-19 testing pending.  Recommended an oral prednisone course given her lung sounds and history of asthma.  Supportive care otherwise. Counseled patient on potential for adverse effects with medications prescribed/recommended today, ER and return-to-clinic precautions discussed, patient verbalized understanding.    Wallis Bamberg, [place] 02/22/21 1643

## 2021-02-22 NOTE — ED Triage Notes (Signed)
Patient c/o non-productive cough, congestion, runny nose, some ear pain since Sunday.  Patient has taken Afrin, Delsym, Mucinex Cold and Flu, Alka Seltzer Cold and Flu.  Patient is vaccinated for COVID.

## 2021-02-24 LAB — NOVEL CORONAVIRUS, NAA: SARS-CoV-2, NAA: NOT DETECTED

## 2021-02-24 LAB — SARS-COV-2, NAA 2 DAY TAT

## 2021-03-21 ENCOUNTER — Other Ambulatory Visit: Payer: Self-pay

## 2021-03-21 ENCOUNTER — Emergency Department (HOSPITAL_BASED_OUTPATIENT_CLINIC_OR_DEPARTMENT_OTHER)
Admission: EM | Admit: 2021-03-21 | Discharge: 2021-03-21 | Disposition: A | Discharge status: Home / Self Care | Payer: 59 | Attending: Emergency Medicine | Admitting: Emergency Medicine

## 2021-03-21 ENCOUNTER — Encounter (HOSPITAL_BASED_OUTPATIENT_CLINIC_OR_DEPARTMENT_OTHER): Payer: Self-pay | Admitting: Obstetrics and Gynecology

## 2021-03-21 ENCOUNTER — Emergency Department (HOSPITAL_BASED_OUTPATIENT_CLINIC_OR_DEPARTMENT_OTHER): Payer: 59

## 2021-03-21 DIAGNOSIS — I10 Essential (primary) hypertension: Secondary | ICD-10-CM | POA: Diagnosis not present

## 2021-03-21 DIAGNOSIS — R059 Cough, unspecified: Secondary | ICD-10-CM | POA: Diagnosis not present

## 2021-03-21 DIAGNOSIS — U071 COVID-19: Secondary | ICD-10-CM | POA: Diagnosis not present

## 2021-03-21 DIAGNOSIS — J45909 Unspecified asthma, uncomplicated: Secondary | ICD-10-CM | POA: Diagnosis not present

## 2021-03-21 DIAGNOSIS — Z91040 Latex allergy status: Secondary | ICD-10-CM | POA: Insufficient documentation

## 2021-03-21 DIAGNOSIS — R0602 Shortness of breath: Secondary | ICD-10-CM | POA: Diagnosis not present

## 2021-03-21 LAB — CBC WITH DIFFERENTIAL/PLATELET
Abs Immature Granulocytes: 0.05 10*3/uL (ref 0.00–0.07)
Basophils Absolute: 0.1 10*3/uL (ref 0.0–0.1)
Basophils Relative: 1 %
Eosinophils Absolute: 0.2 10*3/uL (ref 0.0–0.5)
Eosinophils Relative: 2 %
HCT: 39.7 % (ref 36.0–46.0)
Hemoglobin: 12.7 g/dL (ref 12.0–15.0)
Immature Granulocytes: 1 %
Lymphocytes Relative: 16 %
Lymphs Abs: 1.6 10*3/uL (ref 0.7–4.0)
MCH: 27.4 pg (ref 26.0–34.0)
MCHC: 32 g/dL (ref 30.0–36.0)
MCV: 85.6 fL (ref 80.0–100.0)
Monocytes Absolute: 0.8 10*3/uL (ref 0.1–1.0)
Monocytes Relative: 8 %
Neutro Abs: 7.3 10*3/uL (ref 1.7–7.7)
Neutrophils Relative %: 72 %
Platelets: 243 10*3/uL (ref 150–400)
RBC: 4.64 MIL/uL (ref 3.87–5.11)
RDW: 14.8 % (ref 11.5–15.5)
WBC: 10.1 10*3/uL (ref 4.0–10.5)
nRBC: 0 % (ref 0.0–0.2)

## 2021-03-21 LAB — BASIC METABOLIC PANEL
Anion gap: 7 (ref 5–15)
BUN: 12 mg/dL (ref 6–20)
CO2: 27 mmol/L (ref 22–32)
Calcium: 8.5 mg/dL — ABNORMAL LOW (ref 8.9–10.3)
Chloride: 102 mmol/L (ref 98–111)
Creatinine, Ser: 0.79 mg/dL (ref 0.44–1.00)
GFR, Estimated: >60 mL/min (ref >60)
Glucose, Bld: 127 mg/dL — ABNORMAL HIGH (ref 70–99)
Potassium: 3.6 mmol/L (ref 3.5–5.1)
Sodium: 136 mmol/L (ref 135–145)

## 2021-03-21 LAB — RESP PANEL BY RT-PCR (FLU A&B, COVID) ARPGX2
Influenza A by PCR: NEGATIVE
Influenza B by PCR: NEGATIVE
SARS Coronavirus 2 by RT PCR: POSITIVE — AB

## 2021-03-21 MED ORDER — NIRMATRELVIR/RITONAVIR (PAXLOVID)TABLET: 3.0000 | ORAL_TABLET | Freq: Two times a day (BID) | ORAL | 0 refills | Status: AC

## 2021-03-21 NOTE — ED Provider Notes (Signed)
[facility] EMERGENCY DEPT Provider Note   CSN: [id number] Arrival date & time: 03/21/21  1205     History No chief complaint on file.   [name] is a 40 y.o. female.  HPI  Patient with medical history of prediabetes and allergy induced asthma presents with shortness of breath.  Reports she has been feeling short of breath constantly for the last 3 days, she also has associated Congestion and cough.  Denies any productive cough, no hematemesis, hemoptysis or purulent discharge.  She has tried using her albuterol inhaler which she uses as needed, it has not been helping.  She has been using it every day, reports she used it 4x yesterday without any improvement in the shortness of breath.  The shortness of breath is exacerbated by laying down flat, its worse at night.  Denies any alleviating factors.  Past Medical History:  Diagnosis Date   Allergy-induced asthma    Endometriosis    Endometriosis    Headache    occasional migraines   Hypertension 01/2002   with pregnancy   Kidney stones    Pneumonia    2008   PONV (postoperative nausea and vomiting)    nausea after c-section   Prediabetes     Patient Active Problem List   Diagnosis Date Noted   Pelvic pain in female 08/03/2015    Past Surgical History:  Procedure Laterality Date   ABDOMINAL HYSTERECTOMY N/A 08/03/2015   Procedure: HYSTERECTOMY ABDOMINAL;  Surgeon: Richarda Overlie, MD;  Location: [facility];  Service: Gynecology;  Laterality: N/A;   CESAREAN SECTION     x2   CHOLECYSTECTOMY     KIDNEY STONE SURGERY     LAPAROSCOPY  age 31   laser treatment for endometriosis  age 15   SALPINGOOPHORECTOMY Bilateral 08/03/2015   Procedure: BILATERAL SALPINGO OOPHORECTOMY;  Surgeon: Richarda Overlie, MD;  Location: [facility];  Service: Gynecology;  Laterality: Bilateral;     OB History   No obstetric history on file.     No family history on file.  Social History   Tobacco Use   Smoking status:  Never   Smokeless tobacco: Never  Substance Use Topics   Alcohol use: Yes    Comment: occasional   Drug use: No    Home Medications Prior to Admission medications   Medication Sig Start Date End Date Taking? Authorizing Provider  benzonatate (TESSALON) 100 MG capsule Take 1-2 capsules (100-200 mg total) by mouth 3 (three) times daily as needed for cough. 02/22/21   Wallis Bamberg, PA-C  cetirizine (ZYRTEC ALLERGY) 10 MG tablet Take 1 tablet (10 mg total) by mouth daily. 02/22/21   Wallis Bamberg, PA-C  estradiol (ESTRACE) 2 MG tablet Take 1 tablet (2 mg total) by mouth daily. 01/25/21   Roderick Pee, PA  ibuprofen (ADVIL) 600 MG tablet Take 1 tablet (600 mg total) by mouth every 8 (eight) hours as needed. Take with food. 11/02/20   Cathren Laine, MD  METFORMIN HCL PO Take by mouth.    [provider]  methocarbamol (ROBAXIN) 750 MG tablet Take 1 tablet (750 mg total) by mouth 3 (three) times daily as needed (muscle spasm/pain). 11/02/20   Cathren Laine, MD  ondansetron (ZOFRAN ODT) 4 MG disintegrating tablet Take 1 tablet (4 mg total) by mouth every 8 (eight) hours as needed for nausea or vomiting. 10/24/16   Audry Pili, PA-C  predniSONE (DELTASONE) 20 MG tablet Take 2 tablets daily with breakfast. 02/22/21   Wallis Bamberg,  PA-C  promethazine-dextromethorphan (PROMETHAZINE-DM) 6.25-15 MG/5ML syrup Take 5 mLs by mouth at bedtime as needed for cough. 02/22/21   Wallis Bamberg, PA-C    Allergies    Latex and Sulfa antibiotics  Review of Systems   Review of Systems  HENT:  Positive for congestion. Negative for sore throat.   Respiratory:  Positive for cough, chest tightness and shortness of breath. Negative for wheezing.   Cardiovascular:  Negative for chest pain and leg swelling.  Gastrointestinal:  Negative for nausea and vomiting.  Musculoskeletal:  Negative for back pain.   Physical Exam Updated Vital Signs LMP 07/21/2015 (Exact Date)   Physical Exam Vitals and nursing note reviewed.   Constitutional:      General: She is not in acute distress.    Appearance: She is well-developed. She is obese.  HENT:     Head: Normocephalic and atraumatic.  Eyes:     Conjunctiva/sclera: Conjunctivae normal.  Cardiovascular:     Rate and Rhythm: Normal rate and regular rhythm.     Heart sounds: No murmur heard. Pulmonary:     Effort: Pulmonary effort is normal. No respiratory distress.     Breath sounds: Examination of the right-middle field reveals decreased breath sounds. Examination of the left-middle field reveals decreased breath sounds. Decreased breath sounds present. No wheezing.     Comments: Patient speaking complete sentences, satting 100% on room air.  Somewhat diminished lung sounds to the bases, could be secondary to body habitus.  Unable to auscultate any wheezing. Abdominal:     Palpations: Abdomen is soft.     Tenderness: There is no abdominal tenderness.  Musculoskeletal:     Cervical back: Neck supple.     Right lower leg: No edema.     Left lower leg: No edema.     Comments: Legs are roughly symmetric   Skin:    General: Skin is warm and dry.  Neurological:     Mental Status: She is alert.   ED Results / Procedures / Treatments   Labs (all labs ordered are listed, but only abnormal results are displayed) Labs Reviewed  RESP PANEL BY RT-PCR (FLU A&B, COVID) ARPGX2  BASIC METABOLIC PANEL  CBC WITH DIFFERENTIAL/PLATELET  PREGNANCY, URINE    EKG None  Radiology No results found.  Procedures Procedures   Medications Ordered in ED Medications - No data to display  ED Course  I have reviewed the triage vital signs and the nursing notes.  Pertinent labs & imaging results that were available during my care of the patient were reviewed by me and considered in my medical decision making (see chart for details).    MDM Rules/Calculators/A&P                           Patient is mildly hypertensive, her vitals are stable.  No tachypnea or signs of  respiratory distress.  Patient is not hypoxic or tachycardic, she is also PERC negative.  Doubt PE.  Denies any chest pain, do not think this is ACS.  She is feeling short of breath, does have history of allergy induced asthma.  She is not wheezing on exam, but could be a viral induced asthma exacerbation.  We will also get a chest x-ray to evaluate for possible pneumonia.    CBC within normal normal limits, no leukocytosis concerning for underlying infectious process.  EKG is without any acute findings, no diffuse ST elevation that be concerning for pericarditis.  Patient is not tachycardic, no ST changes.  Doubt ACS.  Radiograph negative for findings consistent with pneumonia.  No ocular congestion.  Heart size irregular contour.  Patient is COVID-positive, CMP shows good kidney function.  We will start her on Paxiloved and have her follow-up with her PCP as needed.  Discharged in stable position.  Final Clinical Impression(s) / ED Diagnoses Final diagnoses:  None    Rx / DC Orders ED Discharge Orders     None        Theron Arista, PA-C 03/21/21 1506    Virgina Norfolk, DO 03/22/21 [phone]

## 2021-03-21 NOTE — ED Notes (Signed)
Attempted iv left upper arm unsuccessfully.

## 2021-03-21 NOTE — ED Notes (Signed)
RT Note: RT Assessment Score obtained.

## 2021-03-21 NOTE — ED Triage Notes (Signed)
Patient reports to the ER for cough and Shortness of breath. Patient endorses no formal treatment for asthma, no maintenance inhaler just albuterol when she feels she needs it which she utilized x4 last night without relief.

## 2021-03-21 NOTE — Discharge Instructions (Addendum)
You have COVID.  This is a virus that will pass on its own, he can take the PaxilOved twice daily to help with symptom management.  I follow-up with your primary care doctor in the next few weeks once you are feeling better for reevaluation if needed.  After 5 days you can return back to work if you are feeling better.

## 2021-04-13 ENCOUNTER — Other Ambulatory Visit (HOSPITAL_COMMUNITY): Payer: Self-pay

## 2021-04-13 MED ORDER — INFLUENZA VAC SPLIT QUAD 0.5 ML IM SUSY
PREFILLED_SYRINGE | INTRAMUSCULAR | 0 refills | Status: DC
  Filled 2021-04-13: qty 0.5, 1d supply, fill #0

## 2021-05-19 ENCOUNTER — Ambulatory Visit: Payer: 59 | Admitting: Physician Assistant

## 2021-05-19 VITALS — BP 149/80 | HR 66 | Temp 98.2°F | Ht 66.0 in | Wt 309.2 lb

## 2021-05-19 DIAGNOSIS — N390 Urinary tract infection, site not specified: Secondary | ICD-10-CM | POA: Diagnosis not present

## 2021-05-19 DIAGNOSIS — R319 Hematuria, unspecified: Secondary | ICD-10-CM | POA: Diagnosis not present

## 2021-05-19 LAB — POC URINALSYSI DIPSTICK (AUTOMATED)
Bilirubin, UA: NEGATIVE
Blood, UA: 1
Glucose, UA: NEGATIVE
Ketones, UA: NEGATIVE
Nitrite, UA: POSITIVE
Protein, UA: POSITIVE — AB
Spec Grav, UA: >=1.03 — AB (ref 1.010–1.025)
Urobilinogen, UA: 0.2 E.U./dL
pH, UA: 5.5 (ref 5.0–8.0)

## 2021-05-19 MED ORDER — NITROFURANTOIN MONOHYD MACRO 100 MG PO CAPS: 100.0000 mg | ORAL_CAPSULE | Freq: Two times a day (BID) | ORAL | 0 refills | Status: AC

## 2021-05-19 NOTE — Progress Notes (Signed)
Subjective:    Patient ID: [name], female    DOB: [birth year], 40 y.o.   MRN: [id number]  Chief Complaint  Patient presents with   Urinary Tract Infection   Obesity   Asthma    HPI 40 y.o. patient presents today for new patient establishment with me.  Patient was previously established with Dr. Daron Offer.  Current Care Team: None    Acute Concerns: Urinary pressure and dysuria with some odor x several months.  -Hx of hysterectomy. She has had kidney stones as well, does not feel like that currently.  -no hematuria, vomiting, nausea, fever, chills, back pain, flank pain   Past Medical History:  Diagnosis Date   Allergy-induced asthma    Endometriosis    Endometriosis    Headache    occasional migraines   Hypertension 01/2002   with pregnancy   Kidney stones    Pneumonia    2008   PONV (postoperative nausea and vomiting)    nausea after c-section   Prediabetes     Past Surgical History:  Procedure Laterality Date   ABDOMINAL HYSTERECTOMY N/A 08/03/2015   Procedure: HYSTERECTOMY ABDOMINAL;  Surgeon: Molli Posey, MD;  Location: [facility];  Service: Gynecology;  Laterality: N/A;   CESAREAN SECTION     x2   CHOLECYSTECTOMY     KIDNEY STONE SURGERY     LAPAROSCOPY  age 19   laser treatment for endometriosis  age 36   SALPINGOOPHORECTOMY Bilateral 08/03/2015   Procedure: BILATERAL SALPINGO OOPHORECTOMY;  Surgeon: Molli Posey, MD;  Location: [facility];  Service: Gynecology;  Laterality: Bilateral;    No family history on file.  Social History   Tobacco Use   Smoking status: Never   Smokeless tobacco: Never  Substance Use Topics   Alcohol use: Yes    Comment: occasional   Drug use: No     Allergies  Allergen Reactions   Latex Rash   Sulfa Antibiotics Rash    Review of Systems NEGATIVE UNLESS OTHERWISE INDICATED IN HPI      Objective:     BP (!) 149/80    Pulse 66    Temp 98.2 F (36.8 C)    Ht 5\' 6"  (1.676 m)    Wt (!) 309 lb 3.2 oz (140.3  kg)    LMP 07/21/2015 (Exact Date)    SpO2 97%    BMI 49.91 kg/m   Wt Readings from Last 3 Encounters:  05/19/21 (!) 309 lb 3.2 oz (140.3 kg)  02/22/21 (!) 320 lb (145.2 kg)  11/02/20 (!) 310 lb (140.6 kg)    BP Readings from Last 3 Encounters:  05/19/21 (!) 149/80  03/21/21 130/72  02/22/21 136/83     Physical Exam Vitals and nursing note reviewed.  Constitutional:      General: She is not in acute distress.    Appearance: Normal appearance. She is not ill-appearing.  HENT:     Head: Normocephalic and atraumatic.  Cardiovascular:     Rate and Rhythm: Normal rate and regular rhythm.     Pulses: Normal pulses.     Heart sounds: Normal heart sounds.  Pulmonary:     Effort: Pulmonary effort is normal.     Breath sounds: Normal breath sounds.  Abdominal:     General: Abdomen is flat. Bowel sounds are normal.     Palpations: Abdomen is soft.     Tenderness: There is no right CVA tenderness or left CVA tenderness.  Skin:    General:  Skin is warm and dry.  Neurological:     General: No focal deficit present.     Mental Status: She is alert.  Psychiatric:        Mood and Affect: Mood normal.       Assessment & Plan:   Problem List Items Addressed This Visit   None Visit Diagnoses     Urinary tract infection with hematuria, site unspecified    -  Primary   Relevant Medications   nitrofurantoin, macrocrystal-monohydrate, (MACROBID) 100 MG capsule   Other Relevant Orders   POCT Urinalysis Dipstick (Automated) (Completed)   Urine Culture (Completed)        Meds ordered this encounter  Medications   nitrofurantoin, macrocrystal-monohydrate, (MACROBID) 100 MG capsule    Sig: Take 1 capsule (100 mg total) by mouth 2 (two) times daily for 7 days.    Dispense:  14 capsule    Refill:  0   1. Urinary tract infection with hematuria, site unspecified U/A performed in office today. Will send urine for culture and treat with macrobid at this time. Increase water intake.  May take AZO for symptomatic relief. Recheck sooner if fever, severe back pain, vomiting, or other acutely worsening symptoms.   Plan to f/up in 4 weeks for fasting labs / CPE / further discussion about further concerns from patient.     [name] M Candace Belford, PA-C

## 2021-05-20 DIAGNOSIS — R319 Hematuria, unspecified: Secondary | ICD-10-CM | POA: Diagnosis not present

## 2021-05-20 DIAGNOSIS — N390 Urinary tract infection, site not specified: Secondary | ICD-10-CM | POA: Diagnosis not present

## 2021-05-22 LAB — URINE CULTURE
MICRO NUMBER:: 12790717
SPECIMEN QUALITY:: ADEQUATE

## 2021-06-16 ENCOUNTER — Other Ambulatory Visit: Payer: Self-pay

## 2021-06-16 ENCOUNTER — Other Ambulatory Visit (HOSPITAL_BASED_OUTPATIENT_CLINIC_OR_DEPARTMENT_OTHER): Payer: Self-pay

## 2021-06-16 ENCOUNTER — Ambulatory Visit (INDEPENDENT_AMBULATORY_CARE_PROVIDER_SITE_OTHER): Payer: 59 | Admitting: Physician Assistant

## 2021-06-16 ENCOUNTER — Encounter: Payer: Self-pay | Admitting: Physician Assistant

## 2021-06-16 VITALS — BP 101/66 | HR 74 | Temp 98.0°F | Ht 66.0 in | Wt 312.2 lb

## 2021-06-16 DIAGNOSIS — M25562 Pain in left knee: Secondary | ICD-10-CM | POA: Diagnosis not present

## 2021-06-16 DIAGNOSIS — G4733 Obstructive sleep apnea (adult) (pediatric): Secondary | ICD-10-CM | POA: Diagnosis not present

## 2021-06-16 DIAGNOSIS — E8941 Symptomatic postprocedural ovarian failure: Secondary | ICD-10-CM | POA: Diagnosis not present

## 2021-06-16 DIAGNOSIS — J4520 Mild intermittent asthma, uncomplicated: Secondary | ICD-10-CM | POA: Diagnosis not present

## 2021-06-16 DIAGNOSIS — Z13220 Encounter for screening for lipoid disorders: Secondary | ICD-10-CM

## 2021-06-16 DIAGNOSIS — Z0000 Encounter for general adult medical examination without abnormal findings: Secondary | ICD-10-CM

## 2021-06-16 DIAGNOSIS — Z131 Encounter for screening for diabetes mellitus: Secondary | ICD-10-CM

## 2021-06-16 DIAGNOSIS — Z1231 Encounter for screening mammogram for malignant neoplasm of breast: Secondary | ICD-10-CM

## 2021-06-16 DIAGNOSIS — R300 Dysuria: Secondary | ICD-10-CM

## 2021-06-16 DIAGNOSIS — M25561 Pain in right knee: Secondary | ICD-10-CM

## 2021-06-16 DIAGNOSIS — R5383 Other fatigue: Secondary | ICD-10-CM

## 2021-06-16 DIAGNOSIS — B372 Candidiasis of skin and nail: Secondary | ICD-10-CM | POA: Diagnosis not present

## 2021-06-16 DIAGNOSIS — M79605 Pain in left leg: Secondary | ICD-10-CM | POA: Diagnosis not present

## 2021-06-16 DIAGNOSIS — J3089 Other allergic rhinitis: Secondary | ICD-10-CM | POA: Diagnosis not present

## 2021-06-16 LAB — POCT URINALYSIS DIPSTICK
Bilirubin, UA: NEGATIVE
Blood, UA: NEGATIVE
Glucose, UA: NEGATIVE
Ketones, UA: NEGATIVE
Leukocytes, UA: NEGATIVE
Nitrite, UA: NEGATIVE
Protein, UA: NEGATIVE
Spec Grav, UA: 1.025 (ref 1.010–1.025)
Urobilinogen, UA: 0.2 E.U./dL
pH, UA: 6 (ref 5.0–8.0)

## 2021-06-16 LAB — LIPID PANEL
Cholesterol: 111 mg/dL (ref 0–200)
HDL: 38.4 mg/dL — ABNORMAL LOW (ref >39.00)
LDL Cholesterol: 55 mg/dL (ref 0–99)
NonHDL: 72.38
Total CHOL/HDL Ratio: 3
Triglycerides: 88 mg/dL (ref 0.0–149.0)
VLDL: 17.6 mg/dL (ref 0.0–40.0)

## 2021-06-16 LAB — CBC WITH DIFFERENTIAL/PLATELET
Basophils Absolute: 0.1 10*3/uL (ref 0.0–0.1)
Basophils Relative: 0.5 % (ref 0.0–3.0)
Eosinophils Absolute: 0.2 10*3/uL (ref 0.0–0.7)
Eosinophils Relative: 2.5 % (ref 0.0–5.0)
HCT: 40.2 % (ref 36.0–46.0)
Hemoglobin: 13 g/dL (ref 12.0–15.0)
Lymphocytes Relative: 25.3 % (ref 12.0–46.0)
Lymphs Abs: 2.4 10*3/uL (ref 0.7–4.0)
MCHC: 32.3 g/dL (ref 30.0–36.0)
MCV: 84.2 fl (ref 78.0–100.0)
Monocytes Absolute: 0.8 10*3/uL (ref 0.1–1.0)
Monocytes Relative: 8.5 % (ref 3.0–12.0)
Neutro Abs: 6 10*3/uL (ref 1.4–7.7)
Neutrophils Relative %: 63.2 % (ref 43.0–77.0)
Platelets: 250 10*3/uL (ref 150.0–400.0)
RBC: 4.77 Mil/uL (ref 3.87–5.11)
RDW: 14.8 % (ref 11.5–15.5)
WBC: 9.4 10*3/uL (ref 4.0–10.5)

## 2021-06-16 LAB — COMPREHENSIVE METABOLIC PANEL
ALT: 16 U/L (ref 0–35)
AST: 15 U/L (ref 0–37)
Albumin: 3.7 g/dL (ref 3.5–5.2)
Alkaline Phosphatase: 88 U/L (ref 39–117)
BUN: 13 mg/dL (ref 6–23)
CO2: 25 mEq/L (ref 19–32)
Calcium: 8.6 mg/dL (ref 8.4–10.5)
Chloride: 104 mEq/L (ref 96–112)
Creatinine, Ser: 0.7 mg/dL (ref 0.40–1.20)
GFR: 106.43 mL/min (ref >60.00)
Glucose, Bld: 104 mg/dL — ABNORMAL HIGH (ref 70–99)
Potassium: 4.2 mEq/L (ref 3.5–5.1)
Sodium: 137 mEq/L (ref 135–145)
Total Bilirubin: 0.6 mg/dL (ref 0.2–1.2)
Total Protein: 6.9 g/dL (ref 6.0–8.3)

## 2021-06-16 LAB — VITAMIN D 25 HYDROXY (VIT D DEFICIENCY, FRACTURES): VITD: 18.21 ng/mL — ABNORMAL LOW (ref 30.00–100.00)

## 2021-06-16 LAB — TSH: TSH: 0.82 u[IU]/mL (ref 0.35–5.50)

## 2021-06-16 LAB — VITAMIN B12: Vitamin B-12: 344 pg/mL (ref 211–911)

## 2021-06-16 MED ORDER — MONTELUKAST SODIUM 10 MG PO TABS
10.0000 mg | ORAL_TABLET | Freq: Every day | ORAL | 1 refills | Status: AC
  Filled 2021-06-16: qty 90, 90d supply, fill #0

## 2021-06-16 MED ORDER — NYSTATIN 100000 UNIT/GM EX CREA
1.0000 "application " | TOPICAL_CREAM | Freq: Two times a day (BID) | CUTANEOUS | 2 refills | Status: DC
  Filled 2021-06-16: qty 30, 15d supply, fill #0

## 2021-06-16 MED ORDER — ALBUTEROL SULFATE (2.5 MG/3ML) 0.083% IN NEBU
2.5000 mg | INHALATION_SOLUTION | Freq: Four times a day (QID) | RESPIRATORY_TRACT | 1 refills | Status: DC | PRN
  Filled 2021-06-16: qty 150, 13d supply, fill #0

## 2021-06-16 MED ORDER — BUDESONIDE-FORMOTEROL FUMARATE 80-4.5 MCG/ACT IN AERO
2.0000 | INHALATION_SPRAY | Freq: Two times a day (BID) | RESPIRATORY_TRACT | 12 refills | Status: DC
  Filled 2021-06-16: qty 10.2, 30d supply, fill #0

## 2021-06-16 MED ORDER — ALBUTEROL SULFATE HFA 108 (90 BASE) MCG/ACT IN AERS
2.0000 | INHALATION_SPRAY | Freq: Four times a day (QID) | RESPIRATORY_TRACT | 2 refills | Status: DC | PRN
  Filled 2021-06-16: qty 8.5, 25d supply, fill #0

## 2021-06-16 MED ORDER — ESTRADIOL 2 MG PO TABS
2.0000 mg | ORAL_TABLET | Freq: Every day | ORAL | 1 refills | Status: AC
  Filled 2021-06-16: qty 90, 90d supply, fill #0

## 2021-06-16 NOTE — Patient Instructions (Addendum)
Good to see you today! Please go to the lab for blood work and I will send results through MyChart.  See you back in 3 months!   Call if any concerns

## 2021-06-16 NOTE — Progress Notes (Signed)
Subjective:    Patient ID: [name], female    DOB: 1981-01-20, 41 y.o.   MRN: [id number]  Chief Complaint  Patient presents with   Annual Exam    HPI Patient is in today for annual exam.   Acute concerns: [place] in June 2022, she was seen at ED on 11/02/2020 - still having left leg pain, posterior thigh and knee. XRAYs were negative. Can't sleep on that side. Hurts with walking, especially after being up on it a lot.   R knee pain - fell about 15 years ago, going up and down stairs hurts, down is worse, she has never had this looked at before   Asthma - Worse after COVID-19, nose stays stopped up, ran out of nebs and albuterol inhaler; at one time had a steroid inhaler - seemed like things were better with that; seems to get more worn out more easily since COVID   Stays tired, difficulty losing weight. Only sleeps 3-4 hrs per night.   Still feels like she has UTI, would like urine rechecked.  Recurrent intertrigo underneath abdominal flap.   Health maintenance: Lifestyle/ exercise: Tries walking daily, parks farther away at work  Nutrition: She has cut back on Dr. Malachi Bonds; trying more salads and to eat more at home Sleep: Only sleeps 3-4 hours, this worsened since COVID-19; loud snoring; fam hx sleep apnea; constantly feels tired - she would consider doing a sleep study Substance use: None ETOH: None  Immunizations: UTD  Colonoscopy: Not high risk, will start at age 8 Pap: Hx hysterectomy  Mammogram: Never done, willing to do one this year    Past Medical History:  Diagnosis Date   Allergy-induced asthma    COVID-19    2020   Endometriosis    Endometriosis    Headache    occasional migraines   Hypertension 01/2002   with pregnancy   Kidney stones    Pneumonia    2008   PONV (postoperative nausea and vomiting)    nausea after c-section   Prediabetes     Past Surgical History:  Procedure Laterality Date   ABDOMINAL HYSTERECTOMY N/A 08/03/2015    Procedure: HYSTERECTOMY ABDOMINAL;  Surgeon: Molli Posey, MD;  Location: [facility];  Service: Gynecology;  Laterality: N/A;   CESAREAN SECTION     x2   CHOLECYSTECTOMY     KIDNEY STONE SURGERY     LAPAROSCOPY  age 23   laser treatment for endometriosis  age 62   SALPINGOOPHORECTOMY Bilateral 08/03/2015   Procedure: BILATERAL SALPINGO OOPHORECTOMY;  Surgeon: Molli Posey, MD;  Location: [facility];  Service: Gynecology;  Laterality: Bilateral;    No family history on file.  Social History   Tobacco Use   Smoking status: Never   Smokeless tobacco: Never  Substance Use Topics   Alcohol use: Yes    Comment: occasional   Drug use: No     Allergies  Allergen Reactions   Latex Rash   Sulfa Antibiotics Rash    Review of Systems NEGATIVE UNLESS OTHERWISE INDICATED IN HPI      Objective:     BP 101/66    Pulse 74    Temp 98 F (36.7 C)    Ht 5\' 6"  (1.676 m)    Wt (!) 312 lb 3.2 oz (141.6 kg)    LMP 07/21/2015 (Exact Date)    SpO2 96%    BMI 50.39 kg/m   Wt Readings from Last 3 Encounters:  06/16/21 (!) 312 lb  3.2 oz (141.6 kg)  05/19/21 (!) 309 lb 3.2 oz (140.3 kg)  02/22/21 (!) 320 lb (145.2 kg)    BP Readings from Last 3 Encounters:  06/16/21 101/66  05/19/21 (!) 149/80  03/21/21 130/72     Physical Exam Vitals and nursing note reviewed.  Constitutional:      Appearance: Normal appearance. She is obese. She is not toxic-appearing.  HENT:     Head: Normocephalic and atraumatic.     Right Ear: Tympanic membrane, ear canal and external ear normal.     Left Ear: Tympanic membrane, ear canal and external ear normal.     Nose: Nose normal.     Mouth/Throat:     Mouth: Mucous membranes are moist.  Eyes:     Extraocular Movements: Extraocular movements intact.     Conjunctiva/sclera: Conjunctivae normal.     Pupils: Pupils are equal, round, and reactive to light.  Cardiovascular:     Rate and Rhythm: Normal rate and regular rhythm.     Pulses: Normal pulses.      Heart sounds: Normal heart sounds.  Pulmonary:     Effort: Pulmonary effort is normal.     Breath sounds: Normal breath sounds.  Abdominal:     General: Abdomen is flat. Bowel sounds are normal.     Palpations: Abdomen is soft.     Tenderness: There is no right CVA tenderness or left CVA tenderness.  Musculoskeletal:        General: Swelling (bilateral ankles, no pitting edema) present. Normal range of motion.     Cervical back: Normal range of motion and neck supple.     Right lower leg: Edema present.     Left lower leg: Edema present.  Skin:    General: Skin is warm and dry.  Neurological:     General: No focal deficit present.     Mental Status: She is alert and oriented to person, place, and time.  Psychiatric:        Mood and Affect: Mood normal.        Behavior: Behavior normal.        Thought Content: Thought content normal.        Judgment: Judgment normal.       Assessment & Plan:   Problem List Items Addressed This Visit   None Visit Diagnoses     Encounter for annual physical exam    -  Primary   Relevant Orders   VITAMIN D 25 Hydroxy (Vit-D Deficiency, Fractures)   Vitamin B12   TSH   Comprehensive metabolic panel   CBC with Differential/Platelet   Lipid panel   Diabetes mellitus screening       Relevant Orders   VITAMIN D 25 Hydroxy (Vit-D Deficiency, Fractures)   Vitamin B12   TSH   Comprehensive metabolic panel   CBC with Differential/Platelet   Lipid panel   Screening for cholesterol level       Relevant Orders   VITAMIN D 25 Hydroxy (Vit-D Deficiency, Fractures)   Vitamin B12   TSH   Comprehensive metabolic panel   CBC with Differential/Platelet   Lipid panel   OSA (obstructive sleep apnea)       Relevant Orders   VITAMIN D 25 Hydroxy (Vit-D Deficiency, Fractures)   Vitamin B12   TSH   Comprehensive metabolic panel   CBC with Differential/Platelet   Lipid panel   Ambulatory referral to Sleep Studies   Other fatigue  Relevant  Orders   VITAMIN D 25 Hydroxy (Vit-D Deficiency, Fractures)   Vitamin B12   TSH   Comprehensive metabolic panel   CBC with Differential/Platelet   Lipid panel   Pain in both knees, unspecified chronicity       Relevant Orders   VITAMIN D 25 Hydroxy (Vit-D Deficiency, Fractures)   Vitamin B12   TSH   Comprehensive metabolic panel   CBC with Differential/Platelet   Lipid panel   Ambulatory referral to Sports Medicine   Left leg pain       Relevant Orders   VITAMIN D 25 Hydroxy (Vit-D Deficiency, Fractures)   Vitamin B12   TSH   Comprehensive metabolic panel   CBC with Differential/Platelet   Lipid panel   Ambulatory referral to Sports Medicine   Encounter for screening mammogram for malignant neoplasm of breast       Relevant Orders   MM 3D SCREEN BREAST BILATERAL   Dysuria       Relevant Orders   POCT urinalysis dipstick   Urine Culture   Mild intermittent asthma without complication       Relevant Medications   montelukast (SINGULAIR) 10 MG tablet   albuterol (PROVENTIL) (2.5 MG/3ML) 0.083% nebulizer solution   albuterol (PROAIR HFA) 108 (90 Base) MCG/ACT inhaler   budesonide-formoterol (SYMBICORT) 80-4.5 MCG/ACT inhaler   Non-seasonal allergic rhinitis, unspecified trigger       Candidiasis, intertrigo       Relevant Medications   nystatin cream (MYCOSTATIN)        Meds ordered this encounter  Medications   nystatin cream (MYCOSTATIN)    Sig: Apply 1 application topically 2 (two) times daily.    Dispense:  30 g    Refill:  2   montelukast (SINGULAIR) 10 MG tablet    Sig: Take 1 tablet (10 mg total) by mouth at bedtime.    Dispense:  90 tablet    Refill:  1   estradiol (ESTRACE) 2 MG tablet    Sig: Take 1 tablet (2 mg total) by mouth daily.    Dispense:  90 tablet    Refill:  1   albuterol (PROVENTIL) (2.5 MG/3ML) 0.083% nebulizer solution    Sig: Take 3 mLs (2.5 mg total) by nebulization every 6 (six) hours as needed for wheezing or shortness of breath.     Dispense:  150 mL    Refill:  1   albuterol (PROAIR HFA) 108 (90 Base) MCG/ACT inhaler    Sig: Inhale 2 puffs into the lungs every 6 (six) hours as needed for wheezing or shortness of breath.    Dispense:  8.5 g    Refill:  2   budesonide-formoterol (SYMBICORT) 80-4.5 MCG/ACT inhaler    Sig: Inhale 2 puffs into the lungs 2 (two) times daily.    Dispense:  10.2 g    Refill:  12   PLAN: -Age-appropriate screening and counseling performed today. Will check labs and call with results. Preventive measures discussed and printed in AVS for patient.  -Referral for sleep study -Referral for sports med -Recheck UA/UC to ensure UTI has cleared, if not, may need more antibiotics -Start Singulair for asthma / allergies; Symbicort and albuterol refilled. SE possibilities discussed. -Nystatin for intertrigo -Estradiol refilled for hot flashes - R/B/A discussed again with pt -Really going to work on weight loss this year, will continue discussion on this after labs. May consider [employer] assistance.    Magalie Almon M Lucien Budney, PA-C

## 2021-06-17 ENCOUNTER — Ambulatory Visit (INDEPENDENT_AMBULATORY_CARE_PROVIDER_SITE_OTHER): Payer: 59

## 2021-06-17 ENCOUNTER — Ambulatory Visit: Payer: 59 | Admitting: Family Medicine

## 2021-06-17 ENCOUNTER — Ambulatory Visit: Payer: Self-pay

## 2021-06-17 ENCOUNTER — Other Ambulatory Visit (HOSPITAL_BASED_OUTPATIENT_CLINIC_OR_DEPARTMENT_OTHER): Payer: Self-pay

## 2021-06-17 ENCOUNTER — Ambulatory Visit: Payer: 59

## 2021-06-17 ENCOUNTER — Other Ambulatory Visit: Payer: Self-pay

## 2021-06-17 VITALS — BP 128/84 | HR 62 | Ht 66.0 in | Wt 316.0 lb

## 2021-06-17 DIAGNOSIS — M25561 Pain in right knee: Secondary | ICD-10-CM

## 2021-06-17 DIAGNOSIS — M25552 Pain in left hip: Secondary | ICD-10-CM

## 2021-06-17 DIAGNOSIS — G8929 Other chronic pain: Secondary | ICD-10-CM | POA: Diagnosis not present

## 2021-06-17 LAB — URINE CULTURE
MICRO NUMBER:: 12886198
SPECIMEN QUALITY:: ADEQUATE

## 2021-06-17 NOTE — Progress Notes (Signed)
I, Philbert RiserKayla Collins, LAT, ATC acting as a scribe for Clementeen GrahamEvan Larren Copes, MD.  Subjective:    CC: L hip/leg and R knee pain  HPI: Pt is a 41 y/o female c/o L leg and R knee pain  Pt reports L hip/leg pain ongoing since slipping and suffering a fall on 11/02/20. When pt fell, her L leg slipped out from under her, landing on her L side. [facility]. Pt locates pain to L posterior thigh and the lateral aspect of the L hip. Pt works in the lab at [employer].  Radiates: no LE Numbness/tingling: yes LE Weakness: yes- esp w/ increased activity Aggravates: L-side lying, walking, increase activity Treatments tried: muscle relaxer  Pt also c/o R knee pain x 6 months. Pt suffered a fall about 15 years ago. Pt locates pain to the anterior aspect of the R knee.  R knee swelling: yes Mechanical symptoms: yes Aggravates: up/down stairs Treatments tried: ice, icy hot, IBU  Dx imaging: 11/02/20 L femur & pelvis XR  Pertinent review of Systems: No fevers or chills  Relevant historical information: Obesity.   Objective:    Vitals:   06/17/21 1535  BP: 128/84  Pulse: 62  SpO2: 97%   General: Well Developed, well nourished, and in no acute distress.   MSK:  Left hip: Normal-appearing Normal motion. Tender palpation greater trochanter and along posterior thigh muscle belly hamstring. Strength intact to flexion.  Hip abduction 4/5 external rotation 4/5.  Extension and knee flexion 4/5.Resisted hip abduction and external rotation and knee flexion all produce pain.  Right knee normal-appearing Normal motion with crepitation. Tender palpation anterior knee mildly. Stable ligamentous exam.  Exam testing limited by guarding. Mildly positive medial McMurray's test.  Varus test limited by guarding. Intact strength. Resisted knee extension strength testing does produce mild pain.   Lab and Radiology Results  Procedure: Real-time Ultrasound Guided Injection of  left hip greater trochanter bursa Device: Philips Affiniti 50G Images permanently stored and available for review in PACS Verbal informed consent obtained.  Discussed risks and benefits of procedure. Warned about infection bleeding damage to structures skin hypopigmentation and fat atrophy among others. Patient expresses understanding and agreement Time-out conducted.   Noted no overlying erythema, induration, or other signs of local infection.   Skin prepped in a sterile fashion.   Local anesthesia: Topical Ethyl chloride.   With sterile technique and under real time ultrasound guidance: 40 mg of Kenalog and 2 mL of Marcaine injected into greater trochanter bursa. Fluid seen entering the bursa.   Completed without difficulty   Pain immediately resolved suggesting accurate placement of the medication.   Advised to call if fevers/chills, erythema, induration, drainage, or persistent bleeding.   Images permanently stored and available for review in the ultrasound unit.  Impression: Technically successful ultrasound guided injection.    Procedure: Real-time Ultrasound Guided Injection of right knee superior lateral patellar space Device: Philips Affiniti 50G Images permanently stored and available for review in PACS Verbal informed consent obtained.  Discussed risks and benefits of procedure. Warned about infection bleeding damage to structures skin hypopigmentation and fat atrophy among others. Patient expresses understanding and agreement Time-out conducted.   Noted no overlying erythema, induration, or other signs of local infection.   Skin prepped in a sterile fashion.   Local anesthesia: Topical Ethyl chloride.   With sterile technique and under real time ultrasound guidance: 40 mg of Kenalog and 2 mL of  Marcaine injected into knee joint. Fluid seen entering the joint capsule.   Completed without difficulty   Pain immediately resolved suggesting accurate placement of the medication.    Advised to call if fevers/chills, erythema, induration, drainage, or persistent bleeding.   Images permanently stored and available for review in the ultrasound unit.  Impression: Technically successful ultrasound guided injection.    X-ray images left hip and right knee obtained today personally and independently interpreted  Left hip: Osteophyte iliac crest laterally.  No acute fractures.  No significant glenohumeral DJD.  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  No acute fractures.  Await formal radiology review   Impression and Recommendations:    Assessment and Plan: 41 y.o. female with   Left lateral and posterior hip and thigh pain after fall.  Symptoms consistent with hip abductor tendinopathy and greater trochanteric bursitis as well as hamstring tendinitis.  Plan to treat with physical therapy.  She is having quite a bit of pain today which is interfering with sleep and work so we will proceed with injection as well at greater trochanter bursa.  Recheck back in 6 weeks.  Return sooner if needed.  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  Again patient is a great candidate for physical therapy.  Although pain is present quite a bit currently so we will use steroid injection as well.  Recheck 6 weeks.Marland Kitchen  PDMP not reviewed this encounter. Orders Placed This Encounter  Procedures   DG HIP UNILAT W OR W/O PELVIS 2-3 VIEWS LEFT    Standing Status:   Future    Number of Occurrences:   1    Standing Expiration Date:   06/17/2022    Order Specific Question:   Reason for Exam (SYMPTOM  OR DIAGNOSIS REQUIRED)    Answer:   left knee pain    Order Specific Question:   Preferred imaging location?    Answer:   [facility]    Order Specific Question:   Is patient pregnant?    Answer:   No   DG Knee AP/LAT W/Sunrise Right    Standing Status:   Future    Number of Occurrences:   1    Standing Expiration Date:    06/17/2022    Order Specific Question:   Reason for Exam (SYMPTOM  OR DIAGNOSIS REQUIRED)    Answer:   right knee pain    Order Specific Question:   Preferred imaging location?    Answer:   [facility]    Order Specific Question:   Is patient pregnant?    Answer:   No   [place] LIMITED JOINT SPACE STRUCTURES LOW RIGHT(NO LINKED CHARGES)    Standing Status:   Future    Number of Occurrences:   1    Standing Expiration Date:   12/15/2021    Order Specific Question:   Reason for Exam (SYMPTOM  OR DIAGNOSIS REQUIRED)    Answer:   right knee pain    Order Specific Question:   Preferred imaging location?    Answer:   [facility] referral to Physical Therapy    Referral Priority:   Routine    Referral Type:   Physical Medicine    Referral Reason:   Specialty Services Required    Requested Specialty:   Physical Therapy    Number of Visits Requested:   1   No orders of the defined types were placed in  this encounter.   Discussed warning signs or symptoms. Please see discharge instructions. Patient expresses understanding.   The above documentation has been reviewed and is accurate and complete Clementeen Graham, M.D.

## 2021-06-17 NOTE — Patient Instructions (Addendum)
Thank you for coming in today.   You received an injection today. Seek immediate medical attention if the joint becomes red, extremely painful, or is oozing fluid.   Please get an Xray today before you leave   I've referred you to Physical Therapy.  Let us know if you don't hear from them in one week.   Recheck in 6 weeks.  

## 2021-06-18 ENCOUNTER — Other Ambulatory Visit (HOSPITAL_BASED_OUTPATIENT_CLINIC_OR_DEPARTMENT_OTHER): Payer: Self-pay

## 2021-06-18 ENCOUNTER — Other Ambulatory Visit: Payer: Self-pay | Admitting: Physician Assistant

## 2021-06-18 ENCOUNTER — Encounter: Payer: Self-pay | Admitting: Physician Assistant

## 2021-06-18 MED ORDER — VITAMIN D (ERGOCALCIFEROL) 1.25 MG (50000 UNIT) PO CAPS
50000.0000 [IU] | ORAL_CAPSULE | ORAL | 0 refills | Status: DC
  Filled 2021-06-18: qty 12, 84d supply, fill #0

## 2021-06-18 NOTE — Progress Notes (Signed)
Right knee x-ray shows a little bit of arthritis changes.

## 2021-06-18 NOTE — Progress Notes (Signed)
Left hip x-ray looks normal to radiology.

## 2021-06-20 ENCOUNTER — Other Ambulatory Visit: Payer: Self-pay | Admitting: Physician Assistant

## 2021-06-20 MED ORDER — OZEMPIC (0.25 OR 0.5 MG/DOSE) 2 MG/3ML ~~LOC~~ SOPN
0.2500 mg | PEN_INJECTOR | SUBCUTANEOUS | 0 refills | Status: AC
  Filled 2021-06-20: qty 1.5, 56d supply, fill #0
  Filled 2022-06-08: qty 3, 28d supply, fill #0
  Filled 2022-06-15 – 2022-06-17 (×2): qty 3, 30d supply, fill #0

## 2021-06-21 ENCOUNTER — Other Ambulatory Visit (HOSPITAL_BASED_OUTPATIENT_CLINIC_OR_DEPARTMENT_OTHER): Payer: Self-pay

## 2021-06-22 ENCOUNTER — Other Ambulatory Visit (HOSPITAL_BASED_OUTPATIENT_CLINIC_OR_DEPARTMENT_OTHER): Payer: Self-pay

## 2021-06-24 ENCOUNTER — Other Ambulatory Visit (HOSPITAL_BASED_OUTPATIENT_CLINIC_OR_DEPARTMENT_OTHER): Payer: Self-pay

## 2021-06-25 ENCOUNTER — Other Ambulatory Visit (HOSPITAL_BASED_OUTPATIENT_CLINIC_OR_DEPARTMENT_OTHER): Payer: Self-pay

## 2021-06-25 NOTE — Telephone Encounter (Signed)
Pt is following up on her medication request and states pharmacy is still waiting on prior authorization.

## 2021-06-28 ENCOUNTER — Other Ambulatory Visit (HOSPITAL_BASED_OUTPATIENT_CLINIC_OR_DEPARTMENT_OTHER): Payer: Self-pay

## 2021-06-29 ENCOUNTER — Ambulatory Visit: Payer: 59 | Admitting: Physical Therapy

## 2021-06-29 ENCOUNTER — Other Ambulatory Visit: Payer: Self-pay

## 2021-06-29 ENCOUNTER — Encounter: Payer: Self-pay | Admitting: Physical Therapy

## 2021-06-29 DIAGNOSIS — M25561 Pain in right knee: Secondary | ICD-10-CM

## 2021-06-29 DIAGNOSIS — M25652 Stiffness of left hip, not elsewhere classified: Secondary | ICD-10-CM

## 2021-06-29 DIAGNOSIS — M25552 Pain in left hip: Secondary | ICD-10-CM | POA: Diagnosis not present

## 2021-06-29 DIAGNOSIS — M6281 Muscle weakness (generalized): Secondary | ICD-10-CM | POA: Diagnosis not present

## 2021-06-29 DIAGNOSIS — R2681 Unsteadiness on feet: Secondary | ICD-10-CM | POA: Diagnosis not present

## 2021-06-29 DIAGNOSIS — R296 Repeated falls: Secondary | ICD-10-CM | POA: Diagnosis not present

## 2021-06-29 DIAGNOSIS — M25661 Stiffness of right knee, not elsewhere classified: Secondary | ICD-10-CM

## 2021-06-29 DIAGNOSIS — G8929 Other chronic pain: Secondary | ICD-10-CM

## 2021-06-29 NOTE — Therapy (Signed)
OUTPATIENT PHYSICAL THERAPY LOWER EXTREMITY EVALUATION   Patient Name: [name] MRN: [id number] DOB:1980/12/16, 41 y.o., female Today's Date: 06/29/2021   PT End of Session - 06/29/21 1612     Visit Number 1    Number of Visits 16    Date for PT Re-Evaluation 08/24/21    Authorization Type [facility] [employer]    Authorization Time Period $25 copay    Progress Note Due on Visit 10    PT Start Time 1345    PT Stop Time 1433    PT Time Calculation (min) 48 min    Activity Tolerance Patient limited by pain    Behavior During Therapy [facility] for tasks assessed/performed             Past Medical History:  Diagnosis Date   Allergy-induced asthma    COVID-19    2020   Endometriosis    Endometriosis    Headache    occasional migraines   Hypertension 01/2002   with pregnancy   Kidney stones    Pneumonia    2008   PONV (postoperative nausea and vomiting)    nausea after c-section   Prediabetes    Past Surgical History:  Procedure Laterality Date   ABDOMINAL HYSTERECTOMY N/A 08/03/2015   Procedure: HYSTERECTOMY ABDOMINAL;  Surgeon: Richarda Overlie, MD;  Location: [facility];  Service: Gynecology;  Laterality: N/A;   CESAREAN SECTION     x2   CHOLECYSTECTOMY     KIDNEY STONE SURGERY     LAPAROSCOPY  age 41   laser treatment for endometriosis  age 41   SALPINGOOPHORECTOMY Bilateral 08/03/2015   Procedure: BILATERAL SALPINGO OOPHORECTOMY;  Surgeon: Richarda Overlie, MD;  Location: [facility];  Service: Gynecology;  Laterality: Bilateral;   Patient Active Problem List   Diagnosis Date Noted   Pelvic pain in female 08/03/2015    PCP: Allwardt, Crist Infante, PA-C  REFERRING PROVIDER: Rodolph Bong, MD  REFERRING DIAG: [phone] (ICD-10-CM) - Chronic pain of right knee M25.552,G89.29 (ICD-10-CM) - Chronic left hip pain   THERAPY DIAG:  Pain in left hip - Plan: PT plan of care cert/re-cert  Chronic pain of right knee - Plan: PT plan of care cert/re-cert  Stiffness of left  hip, not elsewhere classified - Plan: PT plan of care cert/re-cert  Muscle weakness (generalized) - Plan: PT plan of care cert/re-cert  Unsteadiness on feet - Plan: PT plan of care cert/re-cert  Repeated falls - Plan: PT plan of care cert/re-cert  Stiffness of right knee, not elsewhere classified - Plan: PT plan of care cert/re-cert  ONSET DATE: 06/17/2021 MD referral to PT  SUBJECTIVE:   SUBJECTIVE STATEMENT: This 41yo slipped & suffered fall on 11/02/2020 landing on her left side. She had US guided injections to left greater trochanteric bursa and right knee on 06/17/2021. Her right knee was improved with shot but not left hip. She fell again on 06/19/2021 forward catching self with UEs.    PERTINENT HISTORY: Asthma, 2 bouts of Covid,   PAIN:  Are you having pain? Yes NPRS scale:  left hip 6/10 (in last week lowest 3/10 & highest 8/10) Pain location: hip Pain orientation: Left and Lateral  PAIN TYPE: sharp Pain description: intermittent  Aggravating factors: standing & walking Relieving factors: laying down in certain positions, pillows  NPRS scale:  4/10 (in last week lowest 0/10 & highest 7-8/10) Pain location: knee Pain orientation: Right, Anterior, and Distal  PAIN TYPE: aching and sore Pain description: intermittent  Aggravating factors: going down stairs Relieving factors: resting   PRECAUTIONS: None  WEIGHT BEARING RESTRICTIONS No  FALLS:  Has patient fallen in last 6 months? 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 Go hiking.    OBJECTIVE:    DIAGNOSTIC FINDINGS: X-rays Left hip: Osteophyte iliac crest laterally.  No acute fractures.  No significant glenohumeral DJD. 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  No acute fractures.  PATIENT SURVEYS:  FOTO     PT did not complete on visit 1,    MUSCLE LENGTH: Hamstrings: Right -34* deg; Left -38* deg painful - tested with hip flexed to 90* and measured knee ext;  Thomas test: Left -33* deg  POSTURE:  Wide stance, LLE ext rot, ant pelvic tilt with bil hips flexed,   PALPATION: Pain on left iliac crest distally to lateral knee (greater proximally).   LE AROM/PROM:  A/PROM Right 06/29/2021 Left 06/29/2021  Hip flexion  Passive 93 supine  Hip extension  Passive -33* thomas position supine  Hip abduction  Passive WNL  Hip adduction   Passive 18* supine  Hip internal rotation    Hip external rotation    Knee flexion Active supine 112*   Knee extension Seated LAQ -6*   Ankle dorsiflexion    Ankle plantarflexion    Ankle inversion    Ankle eversion     (Blank rows = not tested)  LE MMT:  MMT Right 06/29/2021 Left 06/29/2021  Hip flexion  4/5  Hip extension  4-/5  Hip abduction  4/5 painful  Hip adduction    Hip internal rotation    Hip external rotation    Knee flexion 4/5   Knee extension 4/5   Ankle dorsiflexion    Ankle plantarflexion    Ankle inversion    Ankle eversion     (Blank rows = not tested)  LOWER EXTREMITY SPECIAL TESTS:  Left Hip special tests: Saralyn Pilar (FABER) test: positive , Ober's test: positive , and distraction neg,  scour neg, SI stress +, FADER (flex, adduction) +, SI joint pain  FUNCTIONAL TESTS:  Single leg stance RLE 3 sec LLE 2 sec  GAIT: Distance walked: 100 Assistive device utilized: None Level of assistance: Modified independence Comments: antalgic gait.     TODAY'S TREATMENT: Established HEP    PATIENT EDUCATION:  Education details: HEP Person educated: Patient Education method: Consulting civil engineer, Demonstration,  Corporate treasurer cues, Verbal cues, and Handouts Education comprehension: verbalized understanding, returned demonstration, verbal cues required, tactile cues required, and needs further education   HOME EXERCISE PROGRAM: Access Code: [email or web address] URL: [email or web address] Date: 06/29/2021 Prepared by: Jamey Reas  Exercises Hooklying Single Knee to Chest Stretch - 1-2 x daily - 7 x weekly - 1 sets - 2-3 reps - 30 seconds hold Modified Thomas Stretch - 1-2 x daily - 7 x weekly - 1 sets - 2-3 reps - 30 seconds hold Supine Figure 4 Piriformis Stretch - 1-2 x daily - 7 x weekly - 1 sets - 2-3 reps - 30 seconds hold Supine Bridge - 1-2 x daily - 7 x weekly -  2-3 sets - 10 reps - 5 seconds hold Supine Active Straight Leg Raise - 1-2 x daily - 7 x weekly - 2-3 sets - 10 reps - 5 seconds hold Clamshell - 1-2 x daily - 7 x weekly - 2-3 sets - 10 reps - 5 seconds hold Seated Table Hamstring Stretch - 1-2 x daily - 7 x weekly - 1 sets - 2-3 reps - 30 seconds hold   ASSESSMENT:  CLINICAL IMPRESSION: Patient is a 41 y.o. female who was seen today for physical therapy evaluation and treatment for left hip & right knee pain. Objective impairments include Abnormal gait, decreased activity tolerance, decreased balance, decreased mobility, decreased ROM, decreased strength, impaired flexibility, postural dysfunction, obesity, and pain. These impairments are limiting patient from community activity and occupation. Personal factors including Fitness and 1 comorbidity: asthma  are also affecting patient's functional outcome. This patient has pain in left hip with limited range & strength. She has chronic pain in right knee which is excerbated by overuse with left hip limitations.  She has unsteadiness with multiple falls or near falls in last 6 months.  Patient will benefit from skilled PT to address above impairments and improve overall function.  REHAB POTENTIAL: Good  CLINICAL DECISION MAKING:  Stable/uncomplicated  EVALUATION COMPLEXITY: Low   GOALS: Goals reviewed with patient? Yes  SHORT TERM GOALS:  STG Name Target Date Goal status  1 Patient reports 50% improvement in left hip pain.  Baseline:  07/27/2021 INITIAL  2 Patient reports 50% improvement in right knee pain.  Baseline:  07/27/2021 INITIAL  3 Patient demonstrates & verbalizes understanding of HEP.  Baseline: 07/27/2021 INITIAL   LONG TERM GOALS:   LTG Name Target Date Goal status  1 FOTO improved to target goal. (Needs to complete survey on visit 2) Baseline: 08/24/2021 INITIAL  2 Left hip pain </= 2/10 with activities Baseline: 08/24/2021 INITIAL  3 Right knee pain </= 2/10 with activities.  Baseline: 08/24/2021 INITIAL  4 Left hip AROM WNL  Baseline: 08/24/2021 INITIAL  5 Left hip strength 5/5 Baseline: 08/24/2021 INITIAL  6 Bilateral single leg stance >10sec  Baseline: 08/24/2021 INITIAL   PLAN: PT FREQUENCY: 2x/week  PT DURATION: 8 weeks  PLANNED INTERVENTIONS: Therapeutic exercises, Therapeutic activity, Neuro Muscular re-education, Balance training, Gait training, Patient/Family education, Joint mobilization, Vestibular training, Canalith repositioning, Dry Needling, Electrical stimulation, Cryotherapy, Moist heat, Taping, Ionotophoresis 4mg /ml Dexamethasone, and Manual therapy  PLAN FOR NEXT SESSION: check & update HEP, manual therapy & therapeutic exercise to left hip & right knee   Jamey Reas, PT, DPT 06/29/2021, 4:14 PM

## 2021-06-29 NOTE — Telephone Encounter (Signed)
Patient is requesting call back in regard to PA being processed.  Patient can be reached at [phone].

## 2021-06-30 ENCOUNTER — Telehealth: Payer: Self-pay

## 2021-06-30 ENCOUNTER — Encounter: Payer: 59 | Admitting: Physical Therapy

## 2021-06-30 ENCOUNTER — Other Ambulatory Visit (HOSPITAL_BASED_OUTPATIENT_CLINIC_OR_DEPARTMENT_OTHER): Payer: Self-pay

## 2021-06-30 DIAGNOSIS — R7303 Prediabetes: Secondary | ICD-10-CM | POA: Insufficient documentation

## 2021-06-30 NOTE — Telephone Encounter (Signed)
Patient called back about PA.

## 2021-06-30 NOTE — Telephone Encounter (Signed)
[name] (Key: [id number]) Rx #: [id number] Ozempic (0.25 or 0.5 MG/DOSE) 2MG /1.5ML pen-injectors   Wait for Determination

## 2021-07-01 ENCOUNTER — Other Ambulatory Visit (HOSPITAL_BASED_OUTPATIENT_CLINIC_OR_DEPARTMENT_OTHER): Payer: Self-pay

## 2021-07-05 ENCOUNTER — Ambulatory Visit: Payer: 59 | Admitting: Family Medicine

## 2021-07-05 ENCOUNTER — Encounter: Payer: 59 | Admitting: Physical Therapy

## 2021-07-05 ENCOUNTER — Encounter: Payer: Self-pay | Admitting: *Deleted

## 2021-07-05 ENCOUNTER — Other Ambulatory Visit: Payer: Self-pay

## 2021-07-05 ENCOUNTER — Telehealth: Payer: Self-pay

## 2021-07-05 ENCOUNTER — Other Ambulatory Visit (HOSPITAL_BASED_OUTPATIENT_CLINIC_OR_DEPARTMENT_OTHER): Payer: Self-pay

## 2021-07-05 VITALS — HR 76 | Temp 97.9°F | Wt 314.3 lb

## 2021-07-05 DIAGNOSIS — J029 Acute pharyngitis, unspecified: Secondary | ICD-10-CM

## 2021-07-05 DIAGNOSIS — L080 Pyoderma: Secondary | ICD-10-CM

## 2021-07-05 LAB — POCT RAPID STREP A (OFFICE): Rapid Strep A Screen: NEGATIVE

## 2021-07-05 MED ORDER — TRIAMCINOLONE ACETONIDE 0.1 % EX CREA
1.0000 "application " | TOPICAL_CREAM | Freq: Two times a day (BID) | CUTANEOUS | 0 refills | Status: AC
  Filled 2021-07-05: qty 30, 15d supply, fill #0

## 2021-07-05 MED ORDER — DOXYCYCLINE HYCLATE 100 MG PO CAPS
100.0000 mg | ORAL_CAPSULE | Freq: Two times a day (BID) | ORAL | 0 refills | Status: DC
  Filled 2021-07-05: qty 20, 10d supply, fill #0

## 2021-07-05 NOTE — Therapy (Incomplete)
OUTPATIENT PHYSICAL THERAPY TREATMENT NOTE   Patient Name: [name] MRN: [id number] DOB:[birth year], 41 y.o., female Today's Date: 07/05/2021  PCP: Bary Leriche, PA-C REFERRING PROVIDER: Rodolph Bong, MD    Past Medical History:  Diagnosis Date   Allergy-induced asthma    COVID-19    2020   Endometriosis    Endometriosis    Headache    occasional migraines   Hypertension 01/2002   with pregnancy   Kidney stones    Pneumonia    2008   PONV (postoperative nausea and vomiting)    nausea after c-section   Prediabetes    Past Surgical History:  Procedure Laterality Date   ABDOMINAL HYSTERECTOMY N/A 08/03/2015   Procedure: HYSTERECTOMY ABDOMINAL;  Surgeon: Richarda Overlie, MD;  Location: [facility];  Service: Gynecology;  Laterality: N/A;   CESAREAN SECTION     x2   CHOLECYSTECTOMY     KIDNEY STONE SURGERY     LAPAROSCOPY  age 1   laser treatment for endometriosis  age 22   SALPINGOOPHORECTOMY Bilateral 08/03/2015   Procedure: BILATERAL SALPINGO OOPHORECTOMY;  Surgeon: Richarda Overlie, MD;  Location: [facility];  Service: Gynecology;  Laterality: Bilateral;   Patient Active Problem List   Diagnosis Date Noted   Prediabetes 06/30/2021   Pelvic pain in female 08/03/2015    REFERRING DIAG: M25.561,G89.29 (ICD-10-CM) - Chronic pain of right knee M25.552,G89.29 (ICD-10-CM) - Chronic left hip pain    THERAPY DIAG:  No diagnosis found.  PERTINENT HISTORY: Asthma, 2 bouts of Covid   PRECAUTIONS: Fall  SUBJECTIVE: ***  PAIN:  Are you having pain? {yes/no:20286} NPRS scale: ***/10 Pain location: *** Pain orientation: {Pain Orientation:25161}  PAIN TYPE: {type:313116} Pain description: {PAIN DESCRIPTION:21022940}  Aggravating factors: *** Relieving factors: ***    OBJECTIVE:    PATIENT SURVEYS:  FOTO          LE AROM/PROM:   A/PROM Right 06/29/2021 Left 06/29/2021  Hip flexion   Passive 93 supine  Hip extension   Passive -33* thomas position supine   Hip abduction   Passive WNL  Hip adduction    Passive 18* supine  Hip internal rotation      Hip external rotation      Knee flexion Active supine 112*    Knee extension Seated LAQ -6*    Ankle dorsiflexion      Ankle plantarflexion      Ankle inversion      Ankle eversion       (Blank rows = not tested)   LE MMT:   MMT Right 06/29/2021 Left 06/29/2021  Hip flexion   4/5  Hip extension   4-/5  Hip abduction   4/5 painful  Hip adduction      Hip internal rotation      Hip external rotation      Knee flexion 4/5    Knee extension 4/5    Ankle dorsiflexion      Ankle plantarflexion      Ankle inversion      Ankle eversion       (Blank rows = not tested)   LOWER EXTREMITY SPECIAL TESTS:  06/29/21 Left Hip special tests: Luisa Hart (FABER) test: positive , Ober's test: positive , and distraction neg,  scour neg, SI stress +, FADER (flex, adduction) +, SI joint pain   FUNCTIONAL TESTS:  06/29/21 Single leg stance RLE 3 sec LLE 2 sec   GAIT: 06/29/21   Distance walked: 100 Assistive device utilized: None Level  of assistance: Modified independence Comments: antalgic gait.        TODAY'S TREATMENT: ***     PATIENT EDUCATION:  Education details: *** Person educated: Patient Education method: Explanation, Demonstration, Actor cues, Verbal cues, and Handouts Education comprehension: verbalized understanding, returned demonstration, verbal cues required, tactile cues required, and needs further education     HOME EXERCISE PROGRAM: Access Code: [email or web address] URL: [email or web address] Date: 06/29/2021 Prepared by: Vladimir Faster   Exercises Hooklying Single Knee to Chest Stretch - 1-2 x daily - 7 x weekly - 1 sets - 2-3 reps - 30 seconds hold Modified Thomas Stretch - 1-2 x daily - 7 x weekly - 1 sets - 2-3 reps - 30 seconds hold Supine Figure 4 Piriformis Stretch - 1-2 x daily - 7 x weekly - 1 sets - 2-3 reps - 30 seconds hold Supine Bridge - 1-2 x daily - 7  x weekly - 2-3 sets - 10 reps - 5 seconds hold Supine Active Straight Leg Raise - 1-2 x daily - 7 x weekly - 2-3 sets - 10 reps - 5 seconds hold Clamshell - 1-2 x daily - 7 x weekly - 2-3 sets - 10 reps - 5 seconds hold Seated Table Hamstring Stretch - 1-2 x daily - 7 x weekly - 1 sets - 2-3 reps - 30 seconds hold     ASSESSMENT:   CLINICAL IMPRESSION: ***  Patient is a 41 y.o. female who was seen today for physical therapy evaluation and treatment for left hip & right knee pain. Objective impairments include Abnormal gait, decreased activity tolerance, decreased balance, decreased mobility, decreased ROM, decreased strength, impaired flexibility, postural dysfunction, obesity, and pain. These impairments are limiting patient from community activity and occupation. Personal factors including Fitness and 1 comorbidity: asthma  are also affecting patient's functional outcome. This patient has pain in left hip with limited range & strength. She has chronic pain in right knee which is excerbated by overuse with left hip limitations.  She has unsteadiness with multiple falls or near falls in last 6 months.  Patient will benefit from skilled PT to address above impairments and improve overall function.   REHAB POTENTIAL: Good   CLINICAL DECISION MAKING: Stable/uncomplicated   EVALUATION COMPLEXITY: Low     GOALS: Goals reviewed with patient? Yes   SHORT TERM GOALS:   STG Name Target Date Goal status  1 Patient reports 50% improvement in left hip pain.  Baseline:  07/27/2021 INITIAL  2 Patient reports 50% improvement in right knee pain.  Baseline:  07/27/2021 INITIAL  3 Patient demonstrates & verbalizes understanding of HEP.  Baseline: 07/27/2021 INITIAL    LONG TERM GOALS:    LTG Name Target Date Goal status  1 FOTO improved to target goal. (Needs to complete survey on visit 2) Baseline: 08/24/2021 INITIAL  2 Left hip pain </= 2/10 with activities Baseline: 08/24/2021 INITIAL  3 Right  knee pain </= 2/10 with activities.  Baseline: 08/24/2021 INITIAL  4 Left hip AROM WNL  Baseline: 08/24/2021 INITIAL  5 Left hip strength 5/5 Baseline: 08/24/2021 INITIAL  6 Bilateral single leg stance >10sec  Baseline: 08/24/2021 INITIAL    PLAN: PT FREQUENCY: 2x/week   PT DURATION: 8 weeks   PLANNED INTERVENTIONS: Therapeutic exercises, Therapeutic activity, Neuro Muscular re-education, Balance training, Gait training, Patient/Family education, Joint mobilization, Vestibular training, Canalith repositioning, Dry Needling, Electrical stimulation, Cryotherapy, Moist heat, Taping, Ionotophoresis 4mg /ml Dexamethasone, and Manual therapy   PLAN FOR NEXT SESSION:  *** check & update  HEP, manual therapy & therapeutic exercise to left hip & right knee      Moshe Cipro, PT 07/05/2021, 7:42 AM

## 2021-07-05 NOTE — Progress Notes (Signed)
Established Patient Office Visit  Subjective:  Patient ID: [name], female    DOB: February 26, 1981  Age: 41 y.o. MRN: [id number]  CC:  Chief Complaint  Patient presents with   Rash    Arms, stomach and face, sore throat, fatigue, muscle cramps in legs and feet, x 1 days    HPI [name] presents for the following items  She has pustular rash central part of face both cheeks and nose region.  She apparently had similar rash in the past and was told she may have rosacea.  She has different rash on her arms predominantly mostly antecubital region bilaterally.  This is slightly raised and pruritic.  Nonpustular nonscaly.  No recent changes soaps or detergent.  She tried some over-the-counter hydrocortisone spray without much relief.  Other issue is sore throat and some mild fatigue and muscle cramps in her lower extremities.  She has had muscle cramps in the past.  She tried mustard and increase fluid consumption without much relief.  Does not take any diuretics.  Has had some nonspecific chills but no documented fever.  History of frequent strep pharyngitis in the past.  No cough.  No nausea or vomiting.  Past Medical History:  Diagnosis Date   Allergy-induced asthma    COVID-19    2020   Endometriosis    Endometriosis    Headache    occasional migraines   Hypertension 01/2002   with pregnancy   Kidney stones    Pneumonia    2008   PONV (postoperative nausea and vomiting)    nausea after c-section   Prediabetes     Past Surgical History:  Procedure Laterality Date   ABDOMINAL HYSTERECTOMY N/A 08/03/2015   Procedure: HYSTERECTOMY ABDOMINAL;  Surgeon: Richarda Overlie, MD;  Location: [facility];  Service: Gynecology;  Laterality: N/A;   CESAREAN SECTION     x2   CHOLECYSTECTOMY     KIDNEY STONE SURGERY     LAPAROSCOPY  age 89   laser treatment for endometriosis  age 20   SALPINGOOPHORECTOMY Bilateral 08/03/2015   Procedure: BILATERAL SALPINGO OOPHORECTOMY;   Surgeon: Richarda Overlie, MD;  Location: [facility];  Service: Gynecology;  Laterality: Bilateral;    No family history on file.  Social History   Socioeconomic History   Marital status: Single    Spouse name: Not on file   Number of children: 2   Years of education: Not on file   Highest education level: Not on file  Occupational History    Employer: [employer]  Tobacco Use   Smoking status: Never   Smokeless tobacco: Never  Substance and Sexual Activity   Alcohol use: Yes    Comment: occasional   Drug use: No   Sexual activity: Not on file  Other Topics Concern   Not on file  Social History Narrative   Lives at home with her son and daughter   Social Determinants of Health   Financial Resource Strain: Not on file  Food Insecurity: Not on file  Transportation Needs: Not on file  Physical Activity: Not on file  Stress: Not on file  Social Connections: Not on file  Intimate Partner Violence: Not on file    Outpatient Medications Prior to Visit  Medication Sig Dispense Refill   albuterol (PROAIR HFA) 108 (90 Base) MCG/ACT inhaler Inhale 2 puffs into the lungs every 6 (six) hours as needed for wheezing or shortness of breath. 8.5 g 2   albuterol (  PROVENTIL) (2.5 MG/3ML) 0.083% nebulizer solution Take 3 mLs (2.5 mg total) by nebulization every 6 (six) hours as needed for wheezing or shortness of breath. 150 mL 1   budesonide-formoterol (SYMBICORT) 80-4.5 MCG/ACT inhaler Inhale 2 puffs into the lungs 2 (two) times daily. 10.2 g 12   estradiol (ESTRACE) 2 MG tablet Take 1 tablet (2 mg total) by mouth daily. 90 tablet 1   ibuprofen (ADVIL) 600 MG tablet Take 1 tablet (600 mg total) by mouth every 8 (eight) hours as needed. Take with food. 15 tablet 0   montelukast (SINGULAIR) 10 MG tablet Take 1 tablet (10 mg total) by mouth at bedtime. 90 tablet 1   nystatin cream (MYCOSTATIN) Apply 1 application topically 2 (two) times daily. 30 g 2   Semaglutide,0.25 or 0.5MG /DOS,  (OZEMPIC, 0.25 OR 0.5 MG/DOSE,) 2 MG/1.5ML SOPN Inject 0.25 mg into the skin once a week. 1.5 mL 0   Vitamin D, Ergocalciferol, (DRISDOL) 1.25 MG (50000 UNIT) CAPS capsule Take 1 capsule (50,000 Units total) by mouth every 7 (seven) days. 12 capsule 0   No facility-administered medications prior to visit.    Allergies  Allergen Reactions   Latex Rash   Sulfa Antibiotics Rash    ROS Review of Systems  Constitutional:  Positive for chills and fatigue. Negative for fever.  HENT:  Positive for sore throat.   Respiratory:  Negative for cough and shortness of breath.   Skin:  Positive for rash.     Objective:    Physical Exam Vitals reviewed.  Constitutional:      Appearance: Normal appearance.  HENT:     Right Ear: Tympanic membrane normal.     Left Ear: Tympanic membrane normal.     Mouth/Throat:     Mouth: Mucous membranes are moist.     Pharynx: Oropharynx is clear.     Comments: No significant erythema or exudate. Cardiovascular:     Rate and Rhythm: Normal rate and regular rhythm.  Pulmonary:     Effort: Pulmonary effort is normal.     Breath sounds: Normal breath sounds.  Musculoskeletal:     Cervical back: Neck supple.  Lymphadenopathy:     Cervical: No cervical adenopathy.  Skin:    Comments: She has facial rash bilaterally.  Frequent small papules and pustules especially cheek region with some involvement of the nose as well.  She has a few visible telangiectasias  She has some scattered rash elbow region medially bilaterally in a couple of patches.  Slightly raised and erythematous nonscaly and nonpustular.  Nontender.  Neurological:     Mental Status: She is alert.    Pulse 76    Temp 97.9 F (36.6 C) (Oral)    Wt (!) 314 lb 4.8 oz (142.6 kg)    LMP 07/21/2015 (Exact Date)    SpO2 95%    BMI 50.73 kg/m  Wt Readings from Last 3 Encounters:  07/05/21 (!) 314 lb 4.8 oz (142.6 kg)  06/17/21 (!) 316 lb (143.3 kg)  06/16/21 (!) 312 lb 3.2 oz (141.6 kg)      Health Maintenance Due  Topic Date Due   COVID-19 Vaccine (1) Never done   Hepatitis C Screening  Never done   TETANUS/TDAP  Never done   PAP SMEAR-Modifier  09/03/2017    There are no preventive care reminders to display for this patient.  Lab Results  Component Value Date   TSH 0.82 06/16/2021   Lab Results  Component Value Date   WBC  9.4 06/16/2021   HGB 13.0 06/16/2021   HCT 40.2 06/16/2021   MCV 84.2 06/16/2021   PLT 250.0 06/16/2021   Lab Results  Component Value Date   NA 137 06/16/2021   K 4.2 06/16/2021   CO2 25 06/16/2021   GLUCOSE 104 (H) 06/16/2021   BUN 13 06/16/2021   CREATININE 0.70 06/16/2021   BILITOT 0.6 06/16/2021   ALKPHOS 88 06/16/2021   AST 15 06/16/2021   ALT 16 06/16/2021   PROT 6.9 06/16/2021   ALBUMIN 3.7 06/16/2021   CALCIUM 8.6 06/16/2021   ANIONGAP 7 03/21/2021   GFR 106.43 06/16/2021   Lab Results  Component Value Date   CHOL 111 06/16/2021   Lab Results  Component Value Date   HDL 38.40 (L) 06/16/2021   Lab Results  Component Value Date   LDLCALC 55 06/16/2021   Lab Results  Component Value Date   TRIG 88.0 06/16/2021   Lab Results  Component Value Date   CHOLHDL 3 06/16/2021   No results found for: HGBA1C    Assessment & Plan:   #1 sore throat.  Rapid strep negative.  Suspect probably viral.  Treat symptomatically  #2 facial rash with telangiectasias and pustules especially central face.  Suspect rosacea.  Short-term doxycycline 100 mg twice daily for 10 days to help control current outbreak but we discussed that she may wish to discuss with her primary potential topical therapy such as metronidazole gel  #3 bilateral arm rash.  This looks more allergic.  Try to avoid systemic steroids if possible.  Patient has history of prediabetes.  Triamcinolone 0.1% cream to use twice daily as needed   Meds ordered this encounter  Medications   doxycycline (VIBRAMYCIN) 100 MG capsule    Sig: Take 1 capsule (100 mg  total) by mouth 2 (two) times daily.    Dispense:  20 capsule    Refill:  0   triamcinolone cream (KENALOG) 0.1 %    Sig: Apply 1 application topically 2 (two) times daily.    Dispense:  30 g    Refill:  0    Follow-up: No follow-ups on file.    Evelena Peat, MD

## 2021-07-05 NOTE — Telephone Encounter (Signed)
Patient seen [name] today (2/6) at 4pm.    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 Location confirmed. Rash all over body and face. Additional Comment exhausted search was done. Translation No Nurse Assessment Nurse: Ahorlu, RN, Lajuan Lines Date/Time (Eastern Time): 07/05/2021 2:01:12 PM Confirm and document reason for call. If symptomatic, describe symptoms. ---Caller states has a rash on back of arms, axillary region. trunk, legs and face. Began last night progressively worse and sore throat. Does the patient have any new or worsening symptoms? ---Yes Will a triage be completed? ---Yes Related visit to physician within the last 2 weeks? ---No Does the PT have any chronic conditions? (i.e. diabetes, asthma, this includes High risk factors for pregnancy, etc.) ---Yes List chronic conditions. ---asthma Is the patient pregnant or possibly pregnant? (Ask all females between the ages of 60-55) ---No Is this a behavioral health or substance abuse call? ---No Guidelines Guideline Title Affirmed Question Affirmed Notes Nurse Date/Time (Eastern Time) Rash or Redness - Widespread Large or small blisters on skin (i.e., fluid filled bubbles or sacs) Ahorlu, RN, Lajuan Lines 07/05/2021 2:04:28 PM  Disp. Time Lamount Cohen Time) Disposition Final  User 07/05/2021 2:09:23 PM See HCP within 4 Hours (or PCP triage) Yes Ahorlu, RN, Lutricia Horsfall Disagree/Comply Comply Caller Understands Yes PreDisposition Did not know what to do Care Advice Given Per Guideline SEE HCP (OR PCP TRIAGE) WITHIN 4 HOURS: * IF OFFICE WILL BE OPEN: You need to be seen within the next 3 or 4 hours. Call your doctor (or NP/PA) now or as soon as the office opens. CARE ADVICE given per Rash - Widespread and Cause Unknown (Adult) guideline. Referrals REFERRED TO PCP OFFICE

## 2021-07-09 ENCOUNTER — Ambulatory Visit
Admission: RE | Admit: 2021-07-09 | Discharge: 2021-07-09 | Disposition: A | Discharge status: Home / Self Care | Payer: 59 | Source: Ambulatory Visit | Attending: Physician Assistant | Admitting: Physician Assistant

## 2021-07-09 DIAGNOSIS — Z1231 Encounter for screening mammogram for malignant neoplasm of breast: Secondary | ICD-10-CM

## 2021-07-12 ENCOUNTER — Encounter: Payer: 59 | Admitting: Physical Therapy

## 2021-07-12 ENCOUNTER — Telehealth: Payer: Self-pay | Admitting: Physical Therapy

## 2021-07-12 NOTE — Therapy (Incomplete)
OUTPATIENT PHYSICAL THERAPY TREATMENT NOTE   Patient Name: [name] MRN: [id number] DOB:[birth year], 41 y.o., female Today's Date: 07/12/2021  PCP: Bary Leriche, PA-C REFERRING PROVIDER: Rodolph Bong, MD    Past Medical History:  Diagnosis Date   Allergy-induced asthma    COVID-19    2020   Endometriosis    Endometriosis    Headache    occasional migraines   Hypertension 01/2002   with pregnancy   Kidney stones    Pneumonia    2008   PONV (postoperative nausea and vomiting)    nausea after c-section   Prediabetes    Past Surgical History:  Procedure Laterality Date   ABDOMINAL HYSTERECTOMY N/A 08/03/2015   Procedure: HYSTERECTOMY ABDOMINAL;  Surgeon: Richarda Overlie, MD;  Location: [facility];  Service: Gynecology;  Laterality: N/A;   CESAREAN SECTION     x2   CHOLECYSTECTOMY     KIDNEY STONE SURGERY     LAPAROSCOPY  age 54   laser treatment for endometriosis  age 36   SALPINGOOPHORECTOMY Bilateral 08/03/2015   Procedure: BILATERAL SALPINGO OOPHORECTOMY;  Surgeon: Richarda Overlie, MD;  Location: [facility];  Service: Gynecology;  Laterality: Bilateral;   Patient Active Problem List   Diagnosis Date Noted   Prediabetes 06/30/2021   Pelvic pain in female 08/03/2015    REFERRING DIAG: M25.561,G89.29 (ICD-10-CM) - Chronic pain of right knee M25.552,G89.29 (ICD-10-CM) - Chronic left hip pain   THERAPY DIAG:  No diagnosis found.  PERTINENT HISTORY: Asthma, 2 bouts of Covid  PRECAUTIONS: None  SUBJECTIVE: ***  PAIN:  Are you having pain? {yes/no:20286} NPRS scale: ***/10 Pain location: *** Pain orientation: {Pain Orientation:25161}  PAIN TYPE: {type:313116} Pain description: {PAIN DESCRIPTION:21022940}  Aggravating factors: *** Relieving factors: ***     OBJECTIVE:   PATIENT SURVEYS:  06/29/21 FOTO     PT did not complete on visit 1,     LE AROM/PROM:   A/PROM Right 06/29/2021 Left 06/29/2021  Hip flexion   Passive 93 supine  Hip extension    Passive -33* thomas position supine  Hip abduction   Passive WNL  Hip adduction    Passive 18* supine  Hip internal rotation      Hip external rotation      Knee flexion Active supine 112*    Knee extension Seated LAQ -6*    Ankle dorsiflexion      Ankle plantarflexion      Ankle inversion      Ankle eversion       (Blank rows = not tested)   LE MMT:   MMT Right 06/29/2021 Left 06/29/2021  Hip flexion   4/5  Hip extension   4-/5  Hip abduction   4/5 painful  Hip adduction      Hip internal rotation      Hip external rotation      Knee flexion 4/5    Knee extension 4/5    Ankle dorsiflexion      Ankle plantarflexion      Ankle inversion      Ankle eversion       (Blank rows = not tested)    TODAY'S TREATMENT: 07/12/21 ***    06/29/21 Established HEP      PATIENT EDUCATION:  Education details: HEP Person educated: Patient Education method: Programmer, multimedia, Facilities manager, Actor cues, Verbal cues, and Handouts Education comprehension: verbalized understanding, returned demonstration, verbal cues required, tactile cues required, and needs further education     HOME EXERCISE PROGRAM: Access  Code: [email or web address] URL: [email or web address] Date: 06/29/2021 Prepared by: Jamey Reas   Exercises Hooklying Single Knee to Chest Stretch - 1-2 x daily - 7 x weekly - 1 sets - 2-3 reps - 30 seconds hold Modified Thomas Stretch - 1-2 x daily - 7 x weekly - 1 sets - 2-3 reps - 30 seconds hold Supine Figure 4 Piriformis Stretch - 1-2 x daily - 7 x weekly - 1 sets - 2-3 reps - 30 seconds hold Supine Bridge - 1-2 x daily - 7 x weekly - 2-3 sets - 10 reps - 5 seconds hold Supine Active Straight Leg Raise - 1-2 x daily - 7 x weekly - 2-3 sets - 10 reps - 5 seconds hold Clamshell - 1-2 x daily - 7 x weekly - 2-3 sets - 10 reps - 5 seconds hold Seated Table Hamstring Stretch - 1-2 x daily - 7 x weekly - 1 sets - 2-3 reps - 30 seconds hold     ASSESSMENT:   CLINICAL  IMPRESSION: ***  Patient is a 41 y.o. female who was seen today for physical therapy evaluation and treatment for left hip & right knee pain. Objective impairments include Abnormal gait, decreased activity tolerance, decreased balance, decreased mobility, decreased ROM, decreased strength, impaired flexibility, postural dysfunction, obesity, and pain. These impairments are limiting patient from community activity and occupation. Personal factors including Fitness and 1 comorbidity: asthma  are also affecting patient's functional outcome. This patient has pain in left hip with limited range & strength. She has chronic pain in right knee which is excerbated by overuse with left hip limitations.  She has unsteadiness with multiple falls or near falls in last 6 months.  Patient will benefit from skilled PT to address above impairments and improve overall function.   REHAB POTENTIAL: Good   CLINICAL DECISION MAKING: Stable/uncomplicated   EVALUATION COMPLEXITY: Low     GOALS: Goals reviewed with patient? Yes   SHORT TERM GOALS:   STG Name Target Date Goal status  1 Patient reports 50% improvement in left hip pain.  Baseline:  07/27/2021 INITIAL  2 Patient reports 50% improvement in right knee pain.  Baseline:  07/27/2021 INITIAL  3 Patient demonstrates & verbalizes understanding of HEP.  Baseline: 07/27/2021 INITIAL    LONG TERM GOALS:    LTG Name Target Date Goal status  1 FOTO improved to target goal. (Needs to complete survey on visit 2) Baseline: 08/24/2021 INITIAL  2 Left hip pain </= 2/10 with activities Baseline: 08/24/2021 INITIAL  3 Right knee pain </= 2/10 with activities.  Baseline: 08/24/2021 INITIAL  4 Left hip AROM WNL  Baseline: 08/24/2021 INITIAL  5 Left hip strength 5/5 Baseline: 08/24/2021 INITIAL  6 Bilateral single leg stance >10sec  Baseline: 08/24/2021 INITIAL    PLAN: PT FREQUENCY: 2x/week   PT DURATION: 8 weeks   PLANNED INTERVENTIONS: Therapeutic exercises,  Therapeutic activity, Neuro Muscular re-education, Balance training, Gait training, Patient/Family education, Joint mobilization, Vestibular training, Canalith repositioning, Dry Needling, Electrical stimulation, Cryotherapy, Moist heat, Taping, Ionotophoresis 4mg /ml Dexamethasone, and Manual therapy   PLAN FOR NEXT SESSION:  *** check & update HEP, manual therapy & therapeutic exercise to left hip & right knee      Faustino Congress, PT 07/12/2021, 1:57 PM

## 2021-07-12 NOTE — Telephone Encounter (Signed)
Spoke to pt as she did not show for PT appt today; she reports she worked 3rd shift last night and slept through appt.  Reminded of next PT appt.  Clarita Crane, PT, DPT 07/12/21 3:38 PM

## 2021-07-13 ENCOUNTER — Other Ambulatory Visit: Payer: Self-pay | Admitting: Physician Assistant

## 2021-07-13 DIAGNOSIS — R928 Other abnormal and inconclusive findings on diagnostic imaging of breast: Secondary | ICD-10-CM

## 2021-07-14 ENCOUNTER — Encounter: Payer: Self-pay | Admitting: Rehabilitative and Restorative Service Providers"

## 2021-07-14 ENCOUNTER — Encounter: Payer: 59 | Admitting: Physical Therapy

## 2021-07-14 ENCOUNTER — Other Ambulatory Visit: Payer: Self-pay

## 2021-07-14 ENCOUNTER — Ambulatory Visit: Payer: 59 | Admitting: Rehabilitative and Restorative Service Providers"

## 2021-07-14 DIAGNOSIS — M25561 Pain in right knee: Secondary | ICD-10-CM

## 2021-07-14 DIAGNOSIS — M25552 Pain in left hip: Secondary | ICD-10-CM | POA: Diagnosis not present

## 2021-07-14 DIAGNOSIS — M25652 Stiffness of left hip, not elsewhere classified: Secondary | ICD-10-CM

## 2021-07-14 DIAGNOSIS — M6281 Muscle weakness (generalized): Secondary | ICD-10-CM

## 2021-07-14 DIAGNOSIS — G8929 Other chronic pain: Secondary | ICD-10-CM | POA: Diagnosis not present

## 2021-07-14 DIAGNOSIS — M25661 Stiffness of right knee, not elsewhere classified: Secondary | ICD-10-CM

## 2021-07-14 NOTE — Therapy (Signed)
OUTPATIENT PHYSICAL THERAPY TREATMENT NOTE   Patient Name: [name] MRN: [id number] DOB:Jan 07, 1981, 41 y.o., female Today's Date: 07/14/2021  PCP: Bary Leriche, PA-C REFERRING PROVIDER: Rodolph Bong, MD   PT End of Session - 07/14/21 1209     Visit Number 2    Number of Visits 16    Date for PT Re-Evaluation 08/24/21    Authorization Type [facility] [employer]    Authorization Time Period $25 copay    Progress Note Due on Visit 10    PT Start Time 1200    PT Stop Time 1241    PT Time Calculation (min) 41 min    Activity Tolerance Patient tolerated treatment well    Behavior During Therapy [facility] for tasks assessed/performed             Past Medical History:  Diagnosis Date   Allergy-induced asthma    COVID-19    2020   Endometriosis    Endometriosis    Headache    occasional migraines   Hypertension 01/2002   with pregnancy   Kidney stones    Pneumonia    2008   PONV (postoperative nausea and vomiting)    nausea after c-section   Prediabetes    Past Surgical History:  Procedure Laterality Date   ABDOMINAL HYSTERECTOMY N/A 08/03/2015   Procedure: HYSTERECTOMY ABDOMINAL;  Surgeon: Richarda Overlie, MD;  Location: [facility];  Service: Gynecology;  Laterality: N/A;   CESAREAN SECTION     x2   CHOLECYSTECTOMY     KIDNEY STONE SURGERY     LAPAROSCOPY  age 37   laser treatment for endometriosis  age 1   SALPINGOOPHORECTOMY Bilateral 08/03/2015   Procedure: BILATERAL SALPINGO OOPHORECTOMY;  Surgeon: Richarda Overlie, MD;  Location: [facility];  Service: Gynecology;  Laterality: Bilateral;   Patient Active Problem List   Diagnosis Date Noted   Prediabetes 06/30/2021   Pelvic pain in female 08/03/2015    REFERRING DIAG: M25.561,G89.29 (ICD-10-CM) - Chronic pain of right knee M25.552,G89.29 (ICD-10-CM) - Chronic left hip pain   THERAPY DIAG:  Pain in left hip  Chronic pain of right knee  Stiffness of left hip, not elsewhere classified  Muscle weakness  (generalized)  Stiffness of right knee, not elsewhere classified  PERTINENT HISTORY: Asthma, 2 bouts of Covid  PRECAUTIONS: None  SUBJECTIVE: [name] reports good early HEP compliance.  She notes progress since starting PT.  Prolonged sitting bothers her L hip as does sleeping on the L side.  Stairs are most limiting with her R knee.  Driving bothers the R knee as well as being on her feet too long.  PAIN:  Are you having pain? Yes NPRS scale: 1/10 L hip 0/10 R knee Pain location: See above Pain orientation: Other: See above   PAIN TYPE: aching hip Pain description: sharp knee Aggravating factors: See above Relieving factors: Exercises     OBJECTIVE:    DIAGNOSTIC FINDINGS: X-rays Left hip: Osteophyte iliac crest laterally.  No acute fractures.  No significant glenohumeral DJD. 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  No acute fractures.   PATIENT SURVEYS:  06/29/2021 FOTO     PT did not complete on visit 1,      LE AROM/PROM:   A/PROM Right 06/29/2021 Left 06/29/2021  Hip flexion   Passive 93 supine  Hip extension   Passive -33* thomas position supine  Hip abduction   Passive WNL  Hip adduction    Passive 18* supine  Hip  internal rotation      Hip external rotation      Knee flexion Active supine 112*    Knee extension Seated LAQ -6*    Ankle dorsiflexion      Ankle plantarflexion      Ankle inversion      Ankle eversion       (Blank rows = not tested)   LE MMT:   MMT Right 06/29/2021 Left 06/29/2021  Hip flexion   4/5  Hip extension   4-/5  Hip abduction   4/5 painful  Hip adduction      Hip internal rotation      Hip external rotation      Knee flexion 4/5    Knee extension 4/5    Ankle dorsiflexion      Ankle plantarflexion      Ankle inversion      Ankle eversion       (Blank rows = not tested)         TODAY'S TREATMENT: 07/14/2021 Exercises Single Knee to Chest Stretch other leg straight-  4 reps - 20 seconds hold Modified Thomas  Stretch - 4 reps - 20 seconds hold Supine Figure 4 Piriformis Stretch - 4 reps - 20 seconds hold Supine Bridge - 10 reps - 5 seconds hold Seated Active Straight Leg Raise - 3 sets of 5 - 3 seconds hold Clamshell - 10 reps - 5 seconds hold Seated Table Hamstring Stretch - 4 reps 20 seconds hold Standing lumbar/hip extension 10 reps 3 seconds  Functional activities: limited range step-down (2 inch step) to improve function and pain with steps and curbs  06/29/2021 Established HEP      PATIENT EDUCATION:  Education details: HEP review with correction Person educated: Patient Education method: Explanation, Demonstration, Tactile cues, Verbal cues, and Handouts Education comprehension: verbalized understanding, returned demonstration, verbal cues required, tactile cues required, and needs further education     HOME EXERCISE PROGRAM:     Access Code: [email or web address] URL: [email or web address] Date: 07/14/2021 Prepared by: Pauletta Browns  Exercises Single Knee to Chest Stretch - 1-2 x daily - 7 x weekly - 1 sets - 2-3 reps - 30 seconds hold Modified Thomas Stretch - 1-2 x daily - 7 x weekly - 1 sets - 2-3 reps - 30 seconds hold Supine Figure 4 Piriformis Stretch - 1-2 x daily - 7 x weekly - 1 sets - 2-3 reps - 30 seconds hold Supine Bridge - 1-2 x daily - 7 x weekly - 2-3 sets - 10 reps - 5 seconds hold Seated Active Straight Leg Raise - 1-2 x daily - 7 x weekly - 2-3 sets - 10 reps - 5 seconds hold Clamshell - 1-2 x daily - 7 x weekly - 2-3 sets - 10 reps - 5 seconds hold Seated Table Hamstring Stretch - 1-2 x daily - 7 x weekly - 1 sets - 2-3 reps - 30 seconds hold Standing Lumbar Extension at Wall - Forearms - 5 x daily - 7 x weekly - 1 sets - 5 reps - 3 seconds hold  ASSESSMENT:   CLINICAL IMPRESSION: [name] reports good early HEP compliance and a reduction in overall hip and knee pain since starting PT.  She has a tendency to lock her R knee out when fatigued and  quadriceps weakness is noted with stairs.  Continue hip flexibility and quadriceps strength work to meet long-term goals.    REHAB POTENTIAL: Good   CLINICAL DECISION MAKING: Stable/uncomplicated   EVALUATION  COMPLEXITY: Low     GOALS: Goals reviewed with patient? Yes   SHORT TERM GOALS:   STG Name Target Date Goal status  1 Patient reports 50% improvement in left hip pain.  Baseline:  07/27/2021 INITIAL  2 Patient reports 50% improvement in right knee pain.  Baseline:  07/27/2021 INITIAL  3 Patient demonstrates & verbalizes understanding of HEP.  Baseline: 07/27/2021 INITIAL    LONG TERM GOALS:    LTG Name Target Date Goal status  1 FOTO improved to target goal. (Needs to complete survey on visit 2) Baseline: 08/24/2021 INITIAL  2 Left hip pain </= 2/10 with activities Baseline: 08/24/2021 INITIAL  3 Right knee pain </= 2/10 with activities.  Baseline: 08/24/2021 INITIAL  4 Left hip AROM WNL  Baseline: 08/24/2021 INITIAL  5 Left hip strength 5/5 Baseline: 08/24/2021 INITIAL  6 Bilateral single leg stance >10sec  Baseline: 08/24/2021 INITIAL    PLAN: PT FREQUENCY: 2x/week   PT DURATION: 8 weeks   PLANNED INTERVENTIONS: Therapeutic exercises, Therapeutic activity, Neuro Muscular re-education, Balance training, Gait training, Patient/Family education, Joint mobilization, Vestibular training, Canalith repositioning, Dry Needling, Electrical stimulation, Cryotherapy, Moist heat, Taping, Ionotophoresis 4mg /ml Dexamethasone, and Manual therapy   PLAN FOR NEXT SESSION: Consider tandem balance (knees bent) for overall knee stability, leg press single leg with light weights and continued quadriceps strength progressions.  Check & update HEP, manual therapy & therapeutic exercise to left hip & right knee      , PT, MPT 07/14/2021, 12:50 PM

## 2021-07-19 ENCOUNTER — Encounter: Payer: 59 | Admitting: Physical Therapy

## 2021-07-21 ENCOUNTER — Encounter: Payer: 59 | Admitting: Physical Therapy

## 2021-07-26 ENCOUNTER — Encounter: Payer: 59 | Admitting: Physical Therapy

## 2021-07-28 ENCOUNTER — Encounter: Payer: 59 | Admitting: Physical Therapy

## 2021-07-29 ENCOUNTER — Ambulatory Visit: Payer: 59 | Admitting: Family Medicine

## 2021-08-05 ENCOUNTER — Ambulatory Visit
Admission: RE | Admit: 2021-08-05 | Discharge: 2021-08-05 | Disposition: A | Discharge status: Home / Self Care | Payer: 59 | Source: Ambulatory Visit | Attending: Physician Assistant | Admitting: Physician Assistant

## 2021-08-05 ENCOUNTER — Other Ambulatory Visit: Payer: Self-pay | Admitting: Physician Assistant

## 2021-08-05 DIAGNOSIS — R928 Other abnormal and inconclusive findings on diagnostic imaging of breast: Secondary | ICD-10-CM

## 2021-08-05 DIAGNOSIS — N6489 Other specified disorders of breast: Secondary | ICD-10-CM | POA: Diagnosis not present

## 2021-09-15 ENCOUNTER — Encounter (HOSPITAL_BASED_OUTPATIENT_CLINIC_OR_DEPARTMENT_OTHER): Payer: Self-pay

## 2021-09-15 ENCOUNTER — Ambulatory Visit: Payer: 59 | Admitting: Physician Assistant

## 2021-09-15 ENCOUNTER — Encounter: Payer: Self-pay | Admitting: Physician Assistant

## 2021-09-15 ENCOUNTER — Other Ambulatory Visit (HOSPITAL_BASED_OUTPATIENT_CLINIC_OR_DEPARTMENT_OTHER): Payer: Self-pay

## 2021-09-15 DIAGNOSIS — R7303 Prediabetes: Secondary | ICD-10-CM

## 2021-09-15 DIAGNOSIS — F439 Reaction to severe stress, unspecified: Secondary | ICD-10-CM | POA: Diagnosis not present

## 2021-09-15 DIAGNOSIS — Z6841 Body Mass Index (BMI) 40.0 and over, adult: Secondary | ICD-10-CM | POA: Diagnosis not present

## 2021-09-15 LAB — POCT GLYCOSYLATED HEMOGLOBIN (HGB A1C): Hemoglobin A1C: 5.6 % (ref 4.0–5.6)

## 2021-09-15 MED ORDER — WEGOVY 0.25 MG/0.5ML ~~LOC~~ SOAJ
0.2500 mg | SUBCUTANEOUS | 0 refills | Status: AC
  Filled 2021-09-15: qty 2, 30d supply, fill #0
  Filled 2021-10-05: qty 2, 28d supply, fill #0

## 2021-09-15 NOTE — Patient Instructions (Addendum)
Great to see you today!  ?Your Ha1c is normal, not diabetic. ? ?Referral sent for help with nutrition. ? ?Look at [employer] for all kinds of resources with movement / exercise / eating habits. ? ?Also look into Healthy Weight and Wellness. Let me know if you want a referral. ? ?Also set up an eye appointment. ? ?Try to get [facility] approved  ?

## 2021-09-15 NOTE — Progress Notes (Signed)
? ?Subjective:  ? ? Patient ID: [name], female    DOB: 10-Nov-1980, 41 y.o.   MRN: [id number] ? ?Chief Complaint  ?Patient presents with  ? Weight Check  ? Medication Problem  ?  Pt was not able to get Ozempic approved. She would like to discuss other options.   ? ? ?HPI ?Patient is in today for discussion about weight. ?Ongoing ?Could not get Ozempic approved. ? ?Wakes up around 2:30-3 am. Has to get to work by 5 am.  ?Breakfast at 7 am - usually gets cheese and eggs has a 10-20 min break  ?Gets off at 1:30 pm. Eats around 2 or 2:30 pm -usually eats at home until recent septic issue.  ?Rarely eats dinner.  ?Bed by 8 pm.  ?She's had this schedule about one year now.  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  ? ?Blurry vision for the last month intermittently, having trouble reading up close. The other day woke up with shaking feeling, heart racing. Occasional panic feelings the last two days as well. Septic tank "collapsed" - just found this out a few days ago. ? ? ?Past Medical History:  ?Diagnosis Date  ? Allergy-induced asthma   ? COVID-19   ? 2020  ? Endometriosis   ? Endometriosis   ? Headache   ? occasional migraines  ? Hypertension 01/2002  ? with pregnancy  ? Kidney stones   ? Pneumonia   ? 2008  ? PONV (postoperative nausea and vomiting)   ? nausea after c-section  ? Prediabetes   ? ? ?Past Surgical History:  ?Procedure Laterality Date  ? ABDOMINAL HYSTERECTOMY N/A 08/03/2015  ? Procedure: HYSTERECTOMY ABDOMINAL;  Surgeon: Richarda Overlie, MD;  Location: [facility];  Service: Gynecology;  Laterality: N/A;  ? CESAREAN SECTION    ? x2  ? CHOLECYSTECTOMY    ? KIDNEY STONE SURGERY    ? LAPAROSCOPY  age 68  ? laser treatment for endometriosis  age 65  ? SALPINGOOPHORECTOMY Bilateral 08/03/2015  ? Procedure: BILATERAL SALPINGO OOPHORECTOMY;  Surgeon: Richarda Overlie, MD;  Location: [facility];  Service: Gynecology;  Laterality: Bilateral;  ? ? ?Family History  ?Problem Relation Age of Onset  ? Breast  cancer Maternal Grandmother   ? ? ?Social History  ? ?Tobacco Use  ? Smoking status: Never  ? Smokeless tobacco: Never  ?Substance Use Topics  ? Alcohol use: Yes  ?  Comment: occasional  ? Drug use: No  ?  ? ?Allergies  ?Allergen Reactions  ? Latex Rash  ? Sulfa Antibiotics Rash  ? ? ?Review of Systems ?NEGATIVE UNLESS OTHERWISE INDICATED IN HPI ? ? ?   ?Objective:  ?  ? ?BP (!) 119/50   Pulse 68   Temp 97.7 ?F (36.5 ?C) (Temporal)   Ht 5\' 6"  (1.676 m)   Wt (!) 318 lb (144.2 kg)   LMP 07/21/2015 (Exact Date)   SpO2 96%   BMI 51.33 kg/m?  ? ?Wt Readings from Last 3 Encounters:  ?09/15/21 (!) 318 lb (144.2 kg)  ?07/05/21 (!) 314 lb 4.8 oz (142.6 kg)  ?06/17/21 (!) 316 lb (143.3 kg)  ? ? ?BP Readings from Last 3 Encounters:  ?09/15/21 (!) 119/50  ?06/17/21 128/84  ?06/16/21 101/66  ?  ? ?Physical Exam ?Vitals and nursing note reviewed.  ?Constitutional:   ?   Appearance: Normal appearance. She is obese. She is not toxic-appearing.  ?HENT:  ?   Head: Normocephalic and atraumatic.  ?  Nose: Nose normal.  ?   Mouth/Throat:  ?   Mouth: Mucous membranes are moist.  ?Eyes:  ?   Extraocular Movements: Extraocular movements intact.  ?   Conjunctiva/sclera: Conjunctivae normal.  ?   Pupils: Pupils are equal, round, and reactive to light.  ?Cardiovascular:  ?   Rate and Rhythm: Normal rate and regular rhythm.  ?   Pulses: Normal pulses.  ?   Heart sounds: Normal heart sounds.  ?Pulmonary:  ?   Effort: Pulmonary effort is normal.  ?   Breath sounds: Normal breath sounds.  ?Musculoskeletal:     ?   General: Swelling (bilateral ankles, no pitting edema) present. Normal range of motion.  ?   Cervical back: Normal range of motion and neck supple.  ?   Right lower leg: Edema present.  ?   Left lower leg: Edema present.  ?Skin: ?   General: Skin is warm and dry.  ?Neurological:  ?   General: No focal deficit present.  ?   Mental Status: She is alert and oriented to person, place, and time.  ?Psychiatric:     ?   Mood and  Affect: Mood normal.     ?   Behavior: Behavior normal.     ?   Thought Content: Thought content normal.     ?   Judgment: Judgment normal.  ? ? ?   ?Assessment & Plan:  ? ?Problem List Items Addressed This Visit   ? ?  ? Other  ? Prediabetes  ? Relevant Orders  ? POCT HgB A1C (Completed)  ? Amb Referral to Nutrition and Diabetic Education  ? ?Other Visit Diagnoses   ? ? Morbid obesity (HCC)    -  Primary  ? Relevant Medications  ? Semaglutide-Weight Management (WEGOVY) 0.25 MG/0.5ML SOAJ  ? Other Relevant Orders  ? Amb Referral to Nutrition and Diabetic Education  ? BMI 50.0-59.9, adult (HCC)      ? Relevant Medications  ? Semaglutide-Weight Management (WEGOVY) 0.25 MG/0.5ML SOAJ  ? Other Relevant Orders  ? Amb Referral to Nutrition and Diabetic Education  ? Stress at home      ? ?  ? ? ? ?Meds ordered this encounter  ?Medications  ? Semaglutide-Weight Management (WEGOVY) 0.25 MG/0.5ML SOAJ  ?  Sig: Inject 0.25 mg into the skin once a week.  ?  Dispense:  2 mL  ?  Refill:  0  ?  Order Specific Question:   Supervising Provider  ?  Answer:   Shelva Majestic [[id number]]  ? ?PLAN: ?Long discussion today with patient about lifestyle changes and weight management.  Ozempic was not covered with her insurance.  We will try to get [facility] approved to start on once weekly.  Patient is an employee of [employer].  She has access to the live life well website and they have all kinds of resources for nutrition and exercises.  I encouraged her to look into these resources.  She can also consider healthy weight and wellness.  Her A1c was normal today at 5.6. ? ?I plan to follow-up with her in about 3 months or as needed.  She will let me know about Wegovy.  ? ? ?This note was prepared with assistance of Conservation officer, historic buildings. Occasional wrong-word or sound-a-like substitutions may have occurred due to the inherent limitations of voice recognition software. ? ? ?Ashaunte Standley M Benaiah Behan, PA-C ?

## 2021-09-17 ENCOUNTER — Other Ambulatory Visit (HOSPITAL_BASED_OUTPATIENT_CLINIC_OR_DEPARTMENT_OTHER): Payer: Self-pay

## 2021-09-22 ENCOUNTER — Other Ambulatory Visit (HOSPITAL_BASED_OUTPATIENT_CLINIC_OR_DEPARTMENT_OTHER): Payer: Self-pay

## 2021-09-26 ENCOUNTER — Encounter: Payer: Self-pay | Admitting: Physician Assistant

## 2021-09-28 NOTE — Telephone Encounter (Signed)
Prior Auth completed today.  ?

## 2021-10-01 ENCOUNTER — Other Ambulatory Visit (HOSPITAL_BASED_OUTPATIENT_CLINIC_OR_DEPARTMENT_OTHER): Payer: Self-pay

## 2021-10-04 ENCOUNTER — Other Ambulatory Visit (HOSPITAL_BASED_OUTPATIENT_CLINIC_OR_DEPARTMENT_OTHER): Payer: Self-pay

## 2021-10-05 ENCOUNTER — Other Ambulatory Visit (HOSPITAL_BASED_OUTPATIENT_CLINIC_OR_DEPARTMENT_OTHER): Payer: Self-pay

## 2021-10-06 ENCOUNTER — Other Ambulatory Visit (HOSPITAL_BASED_OUTPATIENT_CLINIC_OR_DEPARTMENT_OTHER): Payer: Self-pay

## 2021-10-07 ENCOUNTER — Other Ambulatory Visit (HOSPITAL_BASED_OUTPATIENT_CLINIC_OR_DEPARTMENT_OTHER): Payer: Self-pay

## 2021-10-07 ENCOUNTER — Telehealth: Payer: Self-pay | Admitting: Physician Assistant

## 2021-10-07 NOTE — Telephone Encounter (Signed)
Caryl Pina with [facility] pharmacy was checking on the status of the PA for pt's rx. I advised it was already faxed in but pt is stating insurance has not received anything yet.  ?

## 2021-10-07 NOTE — Telephone Encounter (Signed)
PA sent 3 days ago. Pending response. ?

## 2021-10-08 ENCOUNTER — Other Ambulatory Visit (HOSPITAL_BASED_OUTPATIENT_CLINIC_OR_DEPARTMENT_OTHER): Payer: Self-pay

## 2021-10-12 ENCOUNTER — Other Ambulatory Visit (HOSPITAL_BASED_OUTPATIENT_CLINIC_OR_DEPARTMENT_OTHER): Payer: Self-pay

## 2021-10-13 NOTE — Telephone Encounter (Signed)
Resent Prior Auth in Cover My Meds. Still pending response.  ?

## 2021-10-15 ENCOUNTER — Other Ambulatory Visit (HOSPITAL_BASED_OUTPATIENT_CLINIC_OR_DEPARTMENT_OTHER): Payer: Self-pay

## 2021-10-18 ENCOUNTER — Other Ambulatory Visit (HOSPITAL_BASED_OUTPATIENT_CLINIC_OR_DEPARTMENT_OTHER): Payer: Self-pay

## 2021-10-19 NOTE — Telephone Encounter (Signed)
Called [phone] for [employer] PA status and was advised by rep they had requested clinical questions to be answered but had incorrect fax number for our office. Answered clinical questions by phone and updated fax number. Rep advised should have response in 24-72 hours.

## 2021-10-22 NOTE — Telephone Encounter (Signed)
[employer] sent denial letter 10/22/21 stating patient not sctivel enrolled into a exercise and caloric reduction program or weight loss modification program.

## 2021-11-02 ENCOUNTER — Encounter: Payer: Self-pay | Admitting: Physical Therapy

## 2021-11-02 NOTE — Therapy (Signed)
OUTPATIENT PHYSICAL THERAPY Discharge Summary   Patient Name: [name] MRN: [id number] DOB:02/22/1981, 41 y.o., female Today's Date: 11/02/2021  PCP: Fredirick Lathe, PA-C REFERRING PROVIDER: Gregor Hams, MD  PHYSICAL THERAPY DISCHARGE SUMMARY  Visits from Start of Care: 2  Current functional level related to goals / functional outcomes: Unknown as patient did not return after 2nd visit.   Remaining deficits: Unknown as patient did not return after 2nd visit. See note below for 2nd visit on 07/14/2021.   Education / Equipment: Initial HEP   Patient agrees to discharge. Patient goals were not met. Patient is being discharged due to not returning since the last visit.  Jamey Reas, PT, DPT Physical Therapist Specializing in Prosthetic Rehab [facility]. [address] [place], [place] [address] Phone [phone] FAX [phone]     Past Medical History:  Diagnosis Date   Allergy-induced asthma    COVID-19    2020   Endometriosis    Endometriosis    Headache    occasional migraines   Hypertension 01/2002   with pregnancy   Kidney stones    Pneumonia    2008   PONV (postoperative nausea and vomiting)    nausea after c-section   Prediabetes    Past Surgical History:  Procedure Laterality Date   ABDOMINAL HYSTERECTOMY N/A 08/03/2015   Procedure: HYSTERECTOMY ABDOMINAL;  Surgeon: Molli Posey, MD;  Location: [place] [facility];  Service: Gynecology;  Laterality: N/A;   CESAREAN SECTION     x2   CHOLECYSTECTOMY     KIDNEY STONE SURGERY     LAPAROSCOPY  age 58   laser treatment for endometriosis  age 42   SALPINGOOPHORECTOMY Bilateral 08/03/2015   Procedure: BILATERAL SALPINGO OOPHORECTOMY;  Surgeon: Molli Posey, MD;  Location: [place] [facility];  Service: Gynecology;  Laterality: Bilateral;   Patient Active Problem List   Diagnosis Date Noted   Prediabetes 06/30/2021   Pelvic pain in female 08/03/2015    REFERRING DIAG:  M25.561,G89.29 (ICD-10-CM) - Chronic pain of right knee M25.552,G89.29 (ICD-10-CM) - Chronic left hip pain   THERAPY DIAG:  No diagnosis found.  PERTINENT HISTORY: Asthma, 2 bouts of Covid  PRECAUTIONS: None  SUBJECTIVE: Breanda reports good early HEP compliance.  She notes progress since starting PT.  Prolonged sitting bothers her L hip as does sleeping on the L side.  Stairs are most limiting with her R knee.  Driving bothers the R knee as well as being on her feet too long.  PAIN:  Are you having pain? Yes NPRS scale: 1/10 L hip 0/10 R knee Pain location: See above Pain orientation: Other: See above   PAIN TYPE: aching hip Pain description: sharp knee Aggravating factors: See above Relieving factors: Exercises     OBJECTIVE:    DIAGNOSTIC FINDINGS: X-rays Left hip: Osteophyte iliac crest laterally.  No acute fractures.  No significant glenohumeral DJD. 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  No acute fractures.   PATIENT SURVEYS:  06/29/2021 FOTO     PT did not complete on visit 1,      LE AROM/PROM:   A/PROM Right 06/29/2021 Left 06/29/2021  Hip flexion   Passive 93 supine  Hip extension   Passive -33* thomas position supine  Hip abduction   Passive WNL  Hip adduction    Passive 18* supine  Hip internal rotation      Hip external rotation      Knee flexion Active supine 112*  Knee extension Seated LAQ -6*    Ankle dorsiflexion      Ankle plantarflexion      Ankle inversion      Ankle eversion       (Blank rows = not tested)   LE MMT:   MMT Right 06/29/2021 Left 06/29/2021  Hip flexion   4/5  Hip extension   4-/5  Hip abduction   4/5 painful  Hip adduction      Hip internal rotation      Hip external rotation      Knee flexion 4/5    Knee extension 4/5    Ankle dorsiflexion      Ankle plantarflexion      Ankle inversion      Ankle eversion       (Blank rows = not tested)         TODAY'S TREATMENT: 07/14/2021 Exercises Single Knee to  Chest Stretch other leg straight-  4 reps - 20 seconds hold Modified Thomas Stretch - 4 reps - 20 seconds hold Supine Figure 4 Piriformis Stretch - 4 reps - 20 seconds hold Supine Bridge - 10 reps - 5 seconds hold Seated Active Straight Leg Raise - 3 sets of 5 - 3 seconds hold Clamshell - 10 reps - 5 seconds hold Seated Table Hamstring Stretch - 4 reps 20 seconds hold Standing lumbar/hip extension 10 reps 3 seconds  Functional activities: limited range step-down (2 inch step) to improve function and pain with steps and curbs  06/29/2021 Established HEP      PATIENT EDUCATION:  Education details: HEP review with correction Person educated: Patient Education method: Explanation, Demonstration, Tactile cues, Verbal cues, and Handouts Education comprehension: verbalized understanding, returned demonstration, verbal cues required, tactile cues required, and needs further education     HOME EXERCISE PROGRAM:     Access Code: [email or web address] URL: [email or web address] Date: 07/14/2021 Prepared by: Vista Mink  Exercises Single Knee to Chest Stretch - 1-2 x daily - 7 x weekly - 1 sets - 2-3 reps - 30 seconds hold Modified Thomas Stretch - 1-2 x daily - 7 x weekly - 1 sets - 2-3 reps - 30 seconds hold Supine Figure 4 Piriformis Stretch - 1-2 x daily - 7 x weekly - 1 sets - 2-3 reps - 30 seconds hold Supine Bridge - 1-2 x daily - 7 x weekly - 2-3 sets - 10 reps - 5 seconds hold Seated Active Straight Leg Raise - 1-2 x daily - 7 x weekly - 2-3 sets - 10 reps - 5 seconds hold Clamshell - 1-2 x daily - 7 x weekly - 2-3 sets - 10 reps - 5 seconds hold Seated Table Hamstring Stretch - 1-2 x daily - 7 x weekly - 1 sets - 2-3 reps - 30 seconds hold Standing Lumbar Extension at Wall - Forearms - 5 x daily - 7 x weekly - 1 sets - 5 reps - 3 seconds hold  ASSESSMENT:   CLINICAL IMPRESSION: Gerrianne reports good early HEP compliance and a reduction in overall hip and knee pain since  starting PT.  She has a tendency to lock her R knee out when fatigued and quadriceps weakness is noted with stairs.  Continue hip flexibility and quadriceps strength work to meet long-term goals.    REHAB POTENTIAL: Good   CLINICAL DECISION MAKING: Stable/uncomplicated   EVALUATION COMPLEXITY: Low     GOALS: Goals reviewed with patient? Yes   SHORT TERM GOALS:   STG Name Target Date  Goal status  1 Patient reports 50% improvement in left hip pain.  Baseline:  07/27/2021 INITIAL  2 Patient reports 50% improvement in right knee pain.  Baseline:  07/27/2021 INITIAL  3 Patient demonstrates & verbalizes understanding of HEP.  Baseline: 07/27/2021 INITIAL    LONG TERM GOALS:    LTG Name Target Date Goal status  1 FOTO improved to target goal. (Needs to complete survey on visit 2) Baseline: 08/24/2021 INITIAL  2 Left hip pain </= 2/10 with activities Baseline: 08/24/2021 INITIAL  3 Right knee pain </= 2/10 with activities.  Baseline: 08/24/2021 INITIAL  4 Left hip AROM WNL  Baseline: 08/24/2021 INITIAL  5 Left hip strength 5/5 Baseline: 08/24/2021 INITIAL  6 Bilateral single leg stance >10sec  Baseline: 08/24/2021 INITIAL    PLAN: PT FREQUENCY: 2x/week   PT DURATION: 8 weeks   PLANNED INTERVENTIONS: Therapeutic exercises, Therapeutic activity, Neuro Muscular re-education, Balance training, Gait training, Patient/Family education, Joint mobilization, Vestibular training, Canalith repositioning, Dry Needling, Electrical stimulation, Cryotherapy, Moist heat, Taping, Ionotophoresis 55m/ml Dexamethasone, and Manual therapy   PLAN FOR NEXT SESSION: Consider tandem balance (knees bent) for overall knee stability, leg press single leg with light weights and continued quadriceps strength progressions.  Check & update HEP, manual therapy & therapeutic exercise to left hip & right knee      RJamey Reas PT, DPT 11/02/2021, 12:44 PM

## 2021-11-05 ENCOUNTER — Other Ambulatory Visit (HOSPITAL_BASED_OUTPATIENT_CLINIC_OR_DEPARTMENT_OTHER): Payer: Self-pay

## 2021-11-05 ENCOUNTER — Other Ambulatory Visit (HOSPITAL_COMMUNITY): Payer: Self-pay

## 2021-11-08 ENCOUNTER — Emergency Department (HOSPITAL_BASED_OUTPATIENT_CLINIC_OR_DEPARTMENT_OTHER): Payer: 59 | Admitting: Radiology

## 2021-11-08 ENCOUNTER — Telehealth: Payer: Self-pay | Admitting: Physician Assistant

## 2021-11-08 ENCOUNTER — Encounter (HOSPITAL_BASED_OUTPATIENT_CLINIC_OR_DEPARTMENT_OTHER): Payer: Self-pay | Admitting: *Deleted

## 2021-11-08 ENCOUNTER — Other Ambulatory Visit: Payer: Self-pay

## 2021-11-08 ENCOUNTER — Other Ambulatory Visit (HOSPITAL_BASED_OUTPATIENT_CLINIC_OR_DEPARTMENT_OTHER): Payer: Self-pay

## 2021-11-08 ENCOUNTER — Emergency Department (HOSPITAL_BASED_OUTPATIENT_CLINIC_OR_DEPARTMENT_OTHER)
Admission: EM | Admit: 2021-11-08 | Discharge: 2021-11-08 | Disposition: A | Discharge status: Home / Self Care | Payer: 59 | Attending: Emergency Medicine | Admitting: Emergency Medicine

## 2021-11-08 DIAGNOSIS — M79602 Pain in left arm: Secondary | ICD-10-CM | POA: Diagnosis not present

## 2021-11-08 DIAGNOSIS — K029 Dental caries, unspecified: Secondary | ICD-10-CM

## 2021-11-08 DIAGNOSIS — Z91040 Latex allergy status: Secondary | ICD-10-CM | POA: Insufficient documentation

## 2021-11-08 DIAGNOSIS — R6884 Jaw pain: Secondary | ICD-10-CM | POA: Insufficient documentation

## 2021-11-08 DIAGNOSIS — R0789 Other chest pain: Secondary | ICD-10-CM | POA: Diagnosis not present

## 2021-11-08 DIAGNOSIS — R110 Nausea: Secondary | ICD-10-CM | POA: Diagnosis not present

## 2021-11-08 DIAGNOSIS — R079 Chest pain, unspecified: Secondary | ICD-10-CM | POA: Diagnosis not present

## 2021-11-08 LAB — BASIC METABOLIC PANEL
Anion gap: 12 (ref 5–15)
BUN: 12 mg/dL (ref 6–20)
CO2: 24 mmol/L (ref 22–32)
Calcium: 9.1 mg/dL (ref 8.9–10.3)
Chloride: 103 mmol/L (ref 98–111)
Creatinine, Ser: 0.68 mg/dL (ref 0.44–1.00)
GFR, Estimated: >60 mL/min (ref >60)
Glucose, Bld: 119 mg/dL — ABNORMAL HIGH (ref 70–99)
Potassium: 3.5 mmol/L (ref 3.5–5.1)
Sodium: 139 mmol/L (ref 135–145)

## 2021-11-08 LAB — TROPONIN I (HIGH SENSITIVITY)
Troponin I (High Sensitivity): 3 ng/L (ref <18)
Troponin I (High Sensitivity): 3 ng/L (ref <18)

## 2021-11-08 LAB — CBC WITH DIFFERENTIAL/PLATELET
Abs Immature Granulocytes: 0.06 10*3/uL (ref 0.00–0.07)
Basophils Absolute: 0.1 10*3/uL (ref 0.0–0.1)
Basophils Relative: 1 %
Eosinophils Absolute: 0.3 10*3/uL (ref 0.0–0.5)
Eosinophils Relative: 3 %
HCT: 42.1 % (ref 36.0–46.0)
Hemoglobin: 13.5 g/dL (ref 12.0–15.0)
Immature Granulocytes: 1 %
Lymphocytes Relative: 28 %
Lymphs Abs: 3.3 10*3/uL (ref 0.7–4.0)
MCH: 27.2 pg (ref 26.0–34.0)
MCHC: 32.1 g/dL (ref 30.0–36.0)
MCV: 84.7 fL (ref 80.0–100.0)
Monocytes Absolute: 0.9 10*3/uL (ref 0.1–1.0)
Monocytes Relative: 7 %
Neutro Abs: 7.1 10*3/uL (ref 1.7–7.7)
Neutrophils Relative %: 60 %
Platelets: 264 10*3/uL (ref 150–400)
RBC: 4.97 MIL/uL (ref 3.87–5.11)
RDW: 14.6 % (ref 11.5–15.5)
WBC: 11.6 10*3/uL — ABNORMAL HIGH (ref 4.0–10.5)
nRBC: 0 % (ref 0.0–0.2)

## 2021-11-08 MED ORDER — PENICILLIN V POTASSIUM 500 MG PO TABS
500.0000 mg | ORAL_TABLET | Freq: Four times a day (QID) | ORAL | 0 refills | Status: AC
  Filled 2021-11-08: qty 28, 7d supply, fill #0

## 2021-11-08 MED ORDER — PENICILLIN V POTASSIUM 250 MG PO TABS
500.0000 mg | ORAL_TABLET | Freq: Once | ORAL | Status: AC
  Administered 2021-11-08: 500 mg via ORAL
  Filled 2021-11-08: qty 2

## 2021-11-08 MED ORDER — KETOROLAC TROMETHAMINE 30 MG/ML IJ SOLN
15.0000 mg | Freq: Once | INTRAMUSCULAR | Status: AC
  Administered 2021-11-08: 15 mg via INTRAVENOUS
  Filled 2021-11-08: qty 1

## 2021-11-08 NOTE — Telephone Encounter (Signed)
Noted and agreed, thank you. 

## 2021-11-08 NOTE — Telephone Encounter (Signed)
Pt is currently at [facility]  Patient Name: [name] Gender: Female DOB: 1980/08/24 Age: 41 Y 1 M 21 D Return Phone Number: [phone] (Primary) Address: City/ State/ Zip: [place]  [address] Client [place] [place] Client Site [facility] [employer] Type Call Who Is Calling Patient / Member / Family / Caregiver Call Type Triage / Clinical Relationship To Patient Self Return Phone Number [phone] (Primary) Chief Complaint Mouth Symptoms Reason for Call Symptomatic / Request for [place] states she has jaw pain, and nauseous. [place] Not Listed [facility] Translation No Nurse Assessment Nurse: Rosana Hoes, RN, Luvenia Starch Date/Time (Eastern Time): 11/08/2021 3:56:23 AM Confirm and document reason for call. If symptomatic, describe symptoms. ---Caller states she has jaw pain radiating into her left arm, nausea, pain intermittent, onset one hour ago. Dizziness when she stood to use the restroom. Does the patient have any new or worsening symptoms? ---Yes Will a triage be completed? ---Yes Related visit to physician within the last 2 weeks? ---No Does the PT have any chronic conditions? (i.e. diabetes, asthma, this includes High risk factors for pregnancy, etc.) ---No Is the patient pregnant or possibly pregnant? (Ask all females between the ages of 10-55) ---No Is this a behavioral health or substance abuse call? ---No Guidelines Guideline Title Affirmed Question Affirmed Notes Nurse Date/Time (Eastern Time) Neurologic Deficit [1] Numbness (i.e., loss of sensation) of the face, arm / hand, or leg / foot on one side of the body AND [2] sudden onset AND [3] present now Rosana Hoes, RN, Luvenia Starch 11/08/2021 3:58:55 AM Disp. Time Eilene Ghazi Time) Disposition Final User 11/08/2021 4:01:28 AM 911 Outcome Documentation Rosana Hoes RN, Luvenia Starch Reason: 911 call refusal, per  caller family will escort patient to ER. 11/08/2021 4:00:13 AM Call EMS 911 Now Yes Rosana Hoes, RN, Gus Puma Disagree/Comply Comply Caller Understands Yes PreDisposition InappropriateToAsk Care Advice Given Per Guideline CALL EMS 911 NOW: * Immediate medical attention is needed. You need to hang up and call 911 (or an ambulance). CARE ADVICE given per Neurologic Deficit (Adult) guideline. * Triager Discretion: I'll call you back in a few minutes to be sure you were able to reach them. Referrals GO TO FACILITY OTHER - SPECIFY

## 2021-11-08 NOTE — ED Provider Notes (Addendum)
[facility] EMERGENCY DEPT Provider Note   CSN: [id number] Arrival date & time: 11/08/21  0431     History  Chief Complaint  Patient presents with   Jaw Pain    [name] is a 41 y.o. female.  The history is provided by the patient.  Illness Location:  Left jaw and arm Quality:  Pain Severity:  Moderate Onset quality:  Sudden Duration:  4 hours Timing:  Constant Progression:  Unchanged Chronicity:  New Context:  At rest Relieved by:  Nothing Worsened by:  Nothing Ineffective treatments:  None Associated symptoms: no fever, no headaches, no loss of consciousness, no sore throat, no vomiting and no wheezing   Patient with a h/o asthma presents with left jaw pain and then left arm pain that awakened her from sleep at approximately 3 am.  She also reports a "funny feeling" in her chest. She had not eaten prior to bed.       Home Medications Prior to Admission medications   Medication Sig Start Date End Date Taking? Authorizing Provider  albuterol (PROAIR HFA) 108 (90 Base) MCG/ACT inhaler Inhale 2 puffs into the lungs every 6 (six) hours as needed for wheezing or shortness of breath. 06/16/21   Allwardt, Alyssa M, PA-C  albuterol (PROVENTIL) (2.5 MG/3ML) 0.083% nebulizer solution Take 3 mLs (2.5 mg total) by nebulization every 6 (six) hours as needed for wheezing or shortness of breath. 06/16/21   Allwardt, Randa Evens, PA-C  budesonide-formoterol (SYMBICORT) 80-4.5 MCG/ACT inhaler Inhale 2 puffs into the lungs 2 (two) times daily. 06/16/21   Allwardt, Randa Evens, PA-C  doxycycline (VIBRAMYCIN) 100 MG capsule Take 1 capsule (100 mg total) by mouth 2 (two) times daily. Patient not taking: Reported on 09/15/2021 07/05/21   Eulas Post, MD  estradiol (ESTRACE) 2 MG tablet Take 1 tablet (2 mg total) by mouth daily. 06/16/21 09/15/21  Allwardt, Randa Evens, PA-C  ibuprofen (ADVIL) 600 MG tablet Take 1 tablet (600 mg total) by mouth every 8 (eight) hours as needed.  Take with food. 11/02/20   Lajean Saver, MD  montelukast (SINGULAIR) 10 MG tablet Take 1 tablet (10 mg total) by mouth at bedtime. 06/16/21 09/14/21  Allwardt, Randa Evens, PA-C  nystatin cream (MYCOSTATIN) Apply 1 application topically 2 (two) times daily. 06/16/21   Allwardt, Randa Evens, PA-C  triamcinolone cream (KENALOG) 0.1 % Apply 1 application topically 2 (two) times daily. 07/05/21   [name], Alinda Sierras, MD  Vitamin D, Ergocalciferol, (DRISDOL) 1.25 MG (50000 UNIT) CAPS capsule Take 1 capsule (50,000 Units total) by mouth every 7 (seven) days. 06/18/21   Allwardt, Randa Evens, PA-C      Allergies    Latex and Sulfa antibiotics    Review of Systems   Review of Systems  Constitutional:  Negative for diaphoresis and fever.  HENT:  Negative for sore throat.        Jaw pain   Eyes:  Negative for redness.  Respiratory:  Negative for wheezing and stridor.   Cardiovascular:        Funny feeling in chest  Gastrointestinal:  Negative for vomiting.  Musculoskeletal:  Positive for arthralgias.  Neurological:  Negative for loss of consciousness and headaches.  All other systems reviewed and are negative.   Physical Exam Updated Vital Signs BP (!) 149/93 (BP Location: Right Arm)   Pulse 77   Temp 98.1 F (36.7 C)   Resp 20   Ht 5\' 6"  (1.676 m)   Wt (!) 145.2 kg  LMP 07/21/2015   SpO2 98%   BMI 51.65 kg/m  Physical Exam Vitals and nursing note reviewed.  Constitutional:      General: She is not in acute distress.    Appearance: Normal appearance. She is well-developed. She is not diaphoretic.  HENT:     Head: Normocephalic and atraumatic.     Nose: Nose normal.     Mouth/Throat:     Mouth: Mucous membranes are moist.     Comments: Dental caries  Eyes:     Conjunctiva/sclera: Conjunctivae normal.     Pupils: Pupils are equal, round, and reactive to light.     Comments: Normal appearance  Cardiovascular:     Rate and Rhythm: Normal rate and regular rhythm.     Pulses: Normal pulses.      Heart sounds: Normal heart sounds.  Pulmonary:     Effort: Pulmonary effort is normal. No respiratory distress.     Breath sounds: Normal breath sounds.  Abdominal:     General: Bowel sounds are normal. There is no distension.     Palpations: Abdomen is soft. There is no mass.     Tenderness: There is no abdominal tenderness. There is no guarding or rebound.  Musculoskeletal:        General: Normal range of motion.     Cervical back: Normal range of motion.  Skin:    General: Skin is warm and dry.     Capillary Refill: Capillary refill takes less than 2 seconds.     Findings: No rash.  Neurological:     General: No focal deficit present.     Mental Status: She is alert and oriented to person, place, and time.     Deep Tendon Reflexes: Reflexes normal.  Psychiatric:        Mood and Affect: Mood normal.        Behavior: Behavior normal.     ED Results / Procedures / Treatments   Labs (all labs ordered are listed, but only abnormal results are displayed) Results for orders placed or performed during the hospital encounter of 11/08/21  CBC with Differential  Result Value Ref Range   WBC 11.6 (H) 4.0 - 10.5 K/uL   RBC 4.97 3.87 - 5.11 MIL/uL   Hemoglobin 13.5 12.0 - 15.0 g/dL   HCT 42.1 36.0 - 46.0 %   MCV 84.7 80.0 - 100.0 fL   MCH 27.2 26.0 - 34.0 pg   MCHC 32.1 30.0 - 36.0 g/dL   RDW 14.6 11.5 - 15.5 %   Platelets 264 150 - 400 K/uL   nRBC 0.0 0.0 - 0.2 %   Neutrophils Relative % 60 %   Neutro Abs 7.1 1.7 - 7.7 K/uL   Lymphocytes Relative 28 %   Lymphs Abs 3.3 0.7 - 4.0 K/uL   Monocytes Relative 7 %   Monocytes Absolute 0.9 0.1 - 1.0 K/uL   Eosinophils Relative 3 %   Eosinophils Absolute 0.3 0.0 - 0.5 K/uL   Basophils Relative 1 %   Basophils Absolute 0.1 0.0 - 0.1 K/uL   Immature Granulocytes 1 %   Abs Immature Granulocytes 0.06 0.00 - 0.07 K/uL  Basic metabolic panel  Result Value Ref Range   Sodium 139 135 - 145 mmol/L   Potassium 3.5 3.5 - 5.1 mmol/L    Chloride 103 98 - 111 mmol/L   CO2 24 22 - 32 mmol/L   Glucose, Bld 119 (H) 70 - 99 mg/dL   BUN 12 6 -  20 mg/dL   Creatinine, Ser 0.68 0.44 - 1.00 mg/dL   Calcium 9.1 8.9 - 10.3 mg/dL   GFR, Estimated >60 >60 mL/min   Anion gap 12 5 - 15  Troponin I (High Sensitivity)  Result Value Ref Range   Troponin I (High Sensitivity) 3 <18 ng/L   DG Chest 2 View  Result Date: 11/08/2021 CLINICAL DATA:  Jaw pain, left upper extremity pain, nausea and lightheadedness. EXAM: CHEST - 2 VIEW COMPARISON:  Portable chest 03/21/2021. FINDINGS: There is mild cardiomegaly with normal caliber of the central vasculature. The lungs clear of infiltrates and edema. No pleural effusion is seen. There is a slightly elevated right hemidiaphragm and a stable mediastinum. Slight thoracic dextroscoliosis. IMPRESSION: No evidence of acute chest disease.  Stable chest with cardiomegaly. Electronically Signed   By: Telford Nab M.D.   On: 11/08/2021 05:41     EKG EKG Interpretation  Date/Time:  Monday November 08 2021 04:49:49 EDT Ventricular Rate:  78 PR Interval:  172 QRS Duration: 88 QT Interval:  374 QTC Calculation: 426 R Axis:   4 Text Interpretation: Normal sinus rhythm Confirmed by Randal Buba, Chaselynn Kepple ([id number]) on 11/08/2021 5:44:27 AM  Radiology DG Chest 2 View  Result Date: 11/08/2021 CLINICAL DATA:  Jaw pain, left upper extremity pain, nausea and lightheadedness. EXAM: CHEST - 2 VIEW COMPARISON:  Portable chest 03/21/2021. FINDINGS: There is mild cardiomegaly with normal caliber of the central vasculature. The lungs clear of infiltrates and edema. No pleural effusion is seen. There is a slightly elevated right hemidiaphragm and a stable mediastinum. Slight thoracic dextroscoliosis. IMPRESSION: No evidence of acute chest disease.  Stable chest with cardiomegaly. Electronically Signed   By: Telford Nab M.D.   On: 11/08/2021 05:41    Procedures Procedures    Medications Ordered in ED Medications - No data to  display  ED Course/ Medical Decision Making/ A&P                           Medical Decision Making Patient with asthma who presents with Left jaw and arm pain since 3 am at rest   Problems Addressed: Dental caries:    Details: penicillin orally initiated in the ED.  Amount and/or Complexity of Data Reviewed External Data Reviewed: notes.    Details: previous notes reviewed. Labs: ordered.    Details: all labs reivewed: negative first troponin 3, normal hemoglobin 13.5 and normal platelet count 264k.  Sodium 139 potassium normal 3.5, normal BUN 12, normal creatinine .50 Radiology: ordered and independent interpretation performed.    Details: no PNA by me on CXR ECG/medicine tests: ordered and independent interpretation performed. Decision-making details documented in ED Course.  Risk Prescription drug management. Risk Details: First troponin is normal.  I have updated the patient on this information.  Patient asked for heat pack for her left jaw.  Nurse is getting this for the patient.   Carie in left lower tooth, molar.  I will prescribe penicillin for this.  Signed out to Dr. Armandina Gemma for delta troponin     Final Clinical Impression(s) / ED Diagnoses Final diagnoses:  None   Signed out to Dr. Armandina Gemma pending delta troponin and reassessment.       Emanual Lamountain, MD 11/08/21 [id number]

## 2021-11-08 NOTE — ED Provider Notes (Signed)
  Physical Exam  BP 118/75   Pulse (!) 57   Temp 98.1 F (36.7 C)   Resp 15   Ht 5\' 6"  (1.676 m)   Wt (!) 145.2 kg   LMP 07/21/2015   SpO2 98%   BMI 51.65 kg/m     Procedures  Procedures  ED Course / MDM    Medical Decision Making Amount and/or Complexity of Data Reviewed Labs: ordered. Radiology: ordered.  Risk Prescription drug management.   41 year old female presenting with jaw pain. Waiting on delta troponin to rule out ACS.   0840 Repeat troponin negative, patient stable for discharge. Penicillin prescribed for dental pain and caries.       53, MD 11/08/21 [phone]

## 2021-11-08 NOTE — Telephone Encounter (Signed)
See triage notes

## 2021-11-08 NOTE — ED Triage Notes (Signed)
Pt states she awoke to jaw pain and left arm pain. C/o of feeling nauseated and light headed. C/o of feeling "funny in my chest". C/o of feeling sob when laying flat. EKG done on arrival to triage.

## 2021-11-22 ENCOUNTER — Ambulatory Visit: Payer: 59 | Admitting: Registered"

## 2021-12-09 ENCOUNTER — Other Ambulatory Visit (HOSPITAL_BASED_OUTPATIENT_CLINIC_OR_DEPARTMENT_OTHER): Payer: Self-pay

## 2021-12-15 ENCOUNTER — Ambulatory Visit: Payer: 59 | Admitting: Physician Assistant

## 2022-02-10 ENCOUNTER — Ambulatory Visit
Admission: RE | Admit: 2022-02-10 | Discharge: 2022-02-10 | Disposition: A | Discharge status: Home / Self Care | Payer: 59 | Source: Ambulatory Visit | Attending: Physician Assistant | Admitting: Physician Assistant

## 2022-02-10 ENCOUNTER — Other Ambulatory Visit: Payer: Self-pay | Admitting: Physician Assistant

## 2022-02-10 DIAGNOSIS — R928 Other abnormal and inconclusive findings on diagnostic imaging of breast: Secondary | ICD-10-CM | POA: Diagnosis not present

## 2022-02-21 ENCOUNTER — Encounter: Payer: Self-pay | Admitting: *Deleted

## 2022-03-01 DIAGNOSIS — H5203 Hypermetropia, bilateral: Secondary | ICD-10-CM | POA: Diagnosis not present

## 2022-05-12 ENCOUNTER — Encounter: Payer: Self-pay | Admitting: *Deleted

## 2022-05-24 DIAGNOSIS — R110 Nausea: Secondary | ICD-10-CM | POA: Diagnosis not present

## 2022-05-24 DIAGNOSIS — R519 Headache, unspecified: Secondary | ICD-10-CM | POA: Diagnosis not present

## 2022-05-26 DIAGNOSIS — R519 Headache, unspecified: Secondary | ICD-10-CM | POA: Diagnosis not present

## 2022-05-29 ENCOUNTER — Emergency Department (HOSPITAL_BASED_OUTPATIENT_CLINIC_OR_DEPARTMENT_OTHER)
Admission: EM | Admit: 2022-05-29 | Discharge: 2022-05-29 | Disposition: A | Discharge status: Home / Self Care | Payer: 59 | Attending: Emergency Medicine | Admitting: Emergency Medicine

## 2022-05-29 ENCOUNTER — Other Ambulatory Visit: Payer: Self-pay

## 2022-05-29 DIAGNOSIS — Z91040 Latex allergy status: Secondary | ICD-10-CM | POA: Insufficient documentation

## 2022-05-29 DIAGNOSIS — J45909 Unspecified asthma, uncomplicated: Secondary | ICD-10-CM | POA: Diagnosis not present

## 2022-05-29 DIAGNOSIS — R059 Cough, unspecified: Secondary | ICD-10-CM | POA: Diagnosis present

## 2022-05-29 DIAGNOSIS — Z7951 Long term (current) use of inhaled steroids: Secondary | ICD-10-CM | POA: Insufficient documentation

## 2022-05-29 DIAGNOSIS — U071 COVID-19: Secondary | ICD-10-CM | POA: Diagnosis not present

## 2022-05-29 DIAGNOSIS — Z8616 Personal history of COVID-19: Secondary | ICD-10-CM | POA: Insufficient documentation

## 2022-05-29 LAB — RESP PANEL BY RT-PCR (RSV, FLU A&B, COVID)  RVPGX2
Influenza A by PCR: NEGATIVE
Influenza B by PCR: NEGATIVE
Resp Syncytial Virus by PCR: NEGATIVE
SARS Coronavirus 2 by RT PCR: POSITIVE — AB

## 2022-05-29 MED ORDER — NIRMATRELVIR/RITONAVIR (PAXLOVID)TABLET: 3.0000 | ORAL_TABLET | Freq: Two times a day (BID) | ORAL | 0 refills | Status: AC

## 2022-05-29 NOTE — ED Triage Notes (Signed)
POV from home, cough, congestion, chills x couple days. Nausea and diarrhea. Sts SOB when she's having coughing fits. Exposed to covid over christmas. NAD, amb to triage, A&O x 4.

## 2022-05-29 NOTE — ED Provider Notes (Signed)
[facility] EMERGENCY DEPT  Provider Note  CSN: [id number] Arrival date & time: 05/29/22 0244  History Chief Complaint  Patient presents with   Cough    [name] is a 41 y.o. female with history of asthma and obesity reports 2 days of worsening cough, chills and some SOB. Exposed to Covid over christmas. She works in [employer] at [employer] had to leave work tonight due to feeling poorly.    Home Medications Prior to Admission medications   Medication Sig Start Date End Date Taking? Authorizing Provider  nirmatrelvir/ritonavir (PAXLOVID) 20 x 150 MG & 10 x 100MG  TABS Take 3 tablets by mouth 2 (two) times daily for 5 days. Patient GFR is >60. Take nirmatrelvir (150 mg) two tablets twice daily for 5 days and ritonavir (100 mg) one tablet twice daily for 5 days. 05/29/22 06/03/22 Yes 08/02/22, MD  albuterol [facility] HFA) 108 [phone] Base) MCG/ACT inhaler Inhale 2 puffs into the lungs every 6 (six) hours as needed for wheezing or shortness of breath. 06/16/21   Allwardt, Alyssa M, PA-C  albuterol (PROVENTIL) (2.5 MG/3ML) 0.083% nebulizer solution Take 3 mLs (2.5 mg total) by nebulization every 6 (six) hours as needed for wheezing or shortness of breath. 06/16/21   Allwardt, 06/18/21, PA-C  budesonide-formoterol (SYMBICORT) 80-4.5 MCG/ACT inhaler Inhale 2 puffs into the lungs 2 (two) times daily. 06/16/21   Allwardt, 06/18/21, PA-C  doxycycline (VIBRAMYCIN) 100 MG capsule Take 1 capsule (100 mg total) by mouth 2 (two) times daily. Patient not taking: Reported on 09/15/2021 07/05/21   09/02/21, MD  estradiol (ESTRACE) 2 MG tablet Take 1 tablet (2 mg total) by mouth daily. 06/16/21 09/15/21  Allwardt, 09/17/21, PA-C  ibuprofen (ADVIL) 600 MG tablet Take 1 tablet (600 mg total) by mouth every 8 (eight) hours as needed. Take with food. 11/02/20   01/02/21, MD  montelukast (SINGULAIR) 10 MG tablet Take 1 tablet (10 mg total) by mouth at bedtime. 06/16/21 09/14/21  Allwardt,  09/16/21, PA-C  nystatin cream (MYCOSTATIN) Apply 1 application topically 2 (two) times daily. 06/16/21   Allwardt, 06/18/21, PA-C  triamcinolone cream (KENALOG) 0.1 % Apply 1 application topically 2 (two) times daily. 07/05/21   [name], 09/02/21, MD  Vitamin D, Ergocalciferol, (DRISDOL) 1.25 MG (50000 UNIT) CAPS capsule Take 1 capsule (50,000 Units total) by mouth every 7 (seven) days. 06/18/21   Allwardt, 06/20/21, PA-C     Allergies    Latex and Sulfa antibiotics   Review of Systems   Review of Systems Please see HPI for pertinent positives and negatives  Physical Exam BP (!) 153/70   Pulse 81   Temp 99.3 F (37.4 C) (Oral)   Resp 17   Ht 5\' 6"  (1.676 m)   Wt (!) 141.5 kg   LMP 07/21/2015   SpO2 99%   BMI 50.36 kg/m   Physical Exam Vitals and nursing note reviewed.  Constitutional:      Appearance: Normal appearance. She is obese.  HENT:     Head: Normocephalic and atraumatic.     Nose: Nose normal.     Mouth/Throat:     Mouth: Mucous membranes are moist.  Eyes:     Extraocular Movements: Extraocular movements intact.     Conjunctiva/sclera: Conjunctivae normal.  Cardiovascular:     Rate and Rhythm: Normal rate.  Pulmonary:     Effort: Pulmonary effort is normal.     Breath sounds: Normal breath  sounds. No wheezing, rhonchi or rales.  Abdominal:     General: Abdomen is flat.     Palpations: Abdomen is soft.     Tenderness: There is no abdominal tenderness.  Musculoskeletal:        General: No swelling. Normal range of motion.     Cervical back: Neck supple.  Skin:    General: Skin is warm and dry.  Neurological:     General: No focal deficit present.     Mental Status: She is alert.  Psychiatric:        Mood and Affect: Mood normal.     ED Results / Procedures / Treatments   EKG None  Procedures Procedures  Medications Ordered in the ED Medications - No data to display  Initial Impression and Plan  Patient here with two days of URI symptoms  after recent Covid exposure. Covid/Flu/RSV swab is positive for Covid. She otherwise looks well with normal SpO2. Recommend she continue OTC symptomatic meds, she has inhaler at home to use as needed. Given her asthma history and obesity she may benefit from Paxlovid. No prior renal insufficiency. Recommend standard quarantine instructions. RTED for any worsening.   ED Course       MDM Rules/Calculators/A&P Medical Decision Making Problems Addressed: COVID-19: acute illness or injury  Amount and/or Complexity of Data Reviewed Labs: ordered. Decision-making details documented in ED Course.  Risk Prescription drug management.    Final Clinical Impression(s) / ED Diagnoses Final diagnoses:  COVID-19    Rx / DC Orders ED Discharge Orders          Ordered    nirmatrelvir/ritonavir (PAXLOVID) 20 x 150 MG & 10 x 100MG  TABS  2 times daily        05/29/22 0518             05/31/22, MD 05/29/22 [phone]

## 2022-06-08 ENCOUNTER — Other Ambulatory Visit (HOSPITAL_BASED_OUTPATIENT_CLINIC_OR_DEPARTMENT_OTHER): Payer: Self-pay

## 2022-06-15 ENCOUNTER — Other Ambulatory Visit (HOSPITAL_BASED_OUTPATIENT_CLINIC_OR_DEPARTMENT_OTHER): Payer: Self-pay

## 2022-06-17 ENCOUNTER — Other Ambulatory Visit (HOSPITAL_BASED_OUTPATIENT_CLINIC_OR_DEPARTMENT_OTHER): Payer: Self-pay

## 2022-06-17 ENCOUNTER — Telehealth: Payer: Self-pay | Admitting: Physician Assistant

## 2022-06-17 NOTE — Telephone Encounter (Signed)
Not showing we have filled this medication since January of 2023 for patient, would you like patient to schedule an appt before sending in prescription?

## 2022-06-17 NOTE — Telephone Encounter (Signed)
  Encourage patient to contact the pharmacy for refills or they can request refills through [facility]:  Please schedule appointment if longer than 1 year  NEXT APPOINTMENT DATE:  MEDICATION: Ozempic  Is the patient out of medication? YES  PHARMACY:  [facility] Phone: [phone]  Fax: [phone]      Let patient know to contact pharmacy at the end of the day to make sure medication is ready.  Please notify patient to allow 48-72 hours to process

## 2022-08-12 ENCOUNTER — Ambulatory Visit
Admission: RE | Admit: 2022-08-12 | Discharge: 2022-08-12 | Disposition: A | Discharge status: Home / Self Care | Payer: Commercial Managed Care - PPO | Source: Ambulatory Visit | Attending: Physician Assistant | Admitting: Physician Assistant

## 2022-08-12 DIAGNOSIS — R928 Other abnormal and inconclusive findings on diagnostic imaging of breast: Secondary | ICD-10-CM | POA: Diagnosis not present

## 2022-08-25 ENCOUNTER — Encounter: Payer: Self-pay | Admitting: Family Medicine

## 2022-08-25 ENCOUNTER — Ambulatory Visit: Payer: Commercial Managed Care - PPO | Admitting: Family Medicine

## 2022-08-25 ENCOUNTER — Other Ambulatory Visit (HOSPITAL_BASED_OUTPATIENT_CLINIC_OR_DEPARTMENT_OTHER): Payer: Self-pay

## 2022-08-25 VITALS — BP 102/70 | HR 79 | Ht 66.0 in | Wt 312.0 lb

## 2022-08-25 DIAGNOSIS — Z7689 Persons encountering health services in other specified circumstances: Secondary | ICD-10-CM | POA: Diagnosis not present

## 2022-08-25 DIAGNOSIS — R0683 Snoring: Secondary | ICD-10-CM

## 2022-08-25 DIAGNOSIS — H539 Unspecified visual disturbance: Secondary | ICD-10-CM | POA: Diagnosis not present

## 2022-08-25 DIAGNOSIS — R7303 Prediabetes: Secondary | ICD-10-CM | POA: Diagnosis not present

## 2022-08-25 DIAGNOSIS — L700 Acne vulgaris: Secondary | ICD-10-CM | POA: Diagnosis not present

## 2022-08-25 DIAGNOSIS — G4733 Obstructive sleep apnea (adult) (pediatric): Secondary | ICD-10-CM | POA: Insufficient documentation

## 2022-08-25 MED ORDER — CLINDAMYCIN PHOS-BENZOYL PEROX 1-5 % EX GEL
Freq: Two times a day (BID) | CUTANEOUS | 0 refills | Status: AC
  Filled 2022-08-25 – 2022-08-26 (×2): qty 25, 30d supply, fill #0

## 2022-08-25 NOTE — Progress Notes (Signed)
New Patient Office Visit  Subjective    Patient ID: [name], female    DOB: [birth year]  Age: 42 y.o. MRN: [id number]  CC:  Chief Complaint  Patient presents with   Establish Care   Weight Loss   Acne   Blurred Vision    HPI [name] presents to establish care.  Was [facility] [place] but this is not on her insurance.  Patient states she was diagnosed with prediabetes several years ago.  Has never taken any medications for it.  Patient has a history of a total hysterectomy with oophorectomy bilaterally.  She was taking estrogen after this but stopped taking it when there was a mass or lump noted on her breast had a mammogram.  She continues to get mammograms.  Has never had an abnormal Pap smear.  Patient states she has been told she snores in the past.  Has morning sleepiness and sometimes falls asleep at work.  Also has some mild headaches in the morning.  Has family members with obstructive sleep apnea.  Patient has been having intermittent blurred vision in both eyes over the past several months.  These are episodes where "everything" becomes blurry.  No darkening of the vision, no double vision.  She went to an optometrist and was given "readers" but these do not help with the blurry vision.  She has a history of migraines as a child.  She also recently has been getting headaches.  These headaches recently are not associated or do not coincide with the blurred vision.  Patient states that since she had to start wearing masks for COVID she has developed acne.  At one point she was given an oral antibiotic for some other reason and this improved her acne symptoms.  They then resolved when she stopped taking the medication.  She has tried several over-the-counter treatments but no medical treatments for acne.  Patient concerned about her weight.  States that the lowest she is ever weighed was 130 pounds prior to having children.  After her daughter was  born she was 160 pounds.  She would like to get back to that weight if possible.  PMH: prediabetes, obesity.    PSH: 2x c/s.  Cholecystectomy,  kidney stone removal.    FH: alcoholism - uncle, PGF - mesothelioma, MI - father, HTN - father.   Tobacco use: no Alcohol use: no Drug use: no Marital status: single.  2 children and 1 grandchild.    Employment: [employer]  Sexual hx: Not sexually active in several years.    Outpatient Encounter Medications as of 08/25/2022  Medication Sig   clindamycin-benzoyl peroxide (BENZACLIN) gel Apply topically 2 (two) times daily.   albuterol (PROAIR HFA) 108 (90 Base) MCG/ACT inhaler Inhale 2 puffs into the lungs every 6 (six) hours as needed for wheezing or shortness of breath. (Patient not taking: Reported on 08/25/2022)   albuterol (PROVENTIL) (2.5 MG/3ML) 0.083% nebulizer solution Take 3 mLs (2.5 mg total) by nebulization every 6 (six) hours as needed for wheezing or shortness of breath. (Patient not taking: Reported on 08/25/2022)   budesonide-formoterol (SYMBICORT) 80-4.5 MCG/ACT inhaler Inhale 2 puffs into the lungs 2 (two) times daily. (Patient not taking: Reported on 08/25/2022)   doxycycline (VIBRAMYCIN) 100 MG capsule Take 1 capsule (100 mg total) by mouth 2 (two) times daily. (Patient not taking: Reported on 09/15/2021)   estradiol (ESTRACE) 2 MG tablet Take 1 tablet (2 mg total) by mouth daily.  ibuprofen (ADVIL) 600 MG tablet Take 1 tablet (600 mg total) by mouth every 8 (eight) hours as needed. Take with food. (Patient not taking: Reported on 08/25/2022)   montelukast (SINGULAIR) 10 MG tablet Take 1 tablet (10 mg total) by mouth at bedtime.   nystatin cream (MYCOSTATIN) Apply 1 application topically 2 (two) times daily. (Patient not taking: Reported on 08/25/2022)   triamcinolone cream (KENALOG) 0.1 % Apply 1 application topically 2 (two) times daily. (Patient not taking: Reported on 08/25/2022)   Vitamin D, Ergocalciferol, (DRISDOL) 1.25 MG  (50000 UNIT) CAPS capsule Take 1 capsule (50,000 Units total) by mouth every 7 (seven) days. (Patient not taking: Reported on 08/25/2022)   No facility-administered encounter medications on file as of 08/25/2022.    Past Medical History:  Diagnosis Date   Allergy-induced asthma    COVID-19    2020   Endometriosis    Endometriosis    Headache    occasional migraines   Hypertension 01/2002   with pregnancy   Kidney stones    Pneumonia    2008   PONV (postoperative nausea and vomiting)    nausea after c-section   Prediabetes     Past Surgical History:  Procedure Laterality Date   ABDOMINAL HYSTERECTOMY N/A 08/03/2015   Procedure: HYSTERECTOMY ABDOMINAL;  Surgeon: Molli Posey, MD;  Location: [facility];  Service: Gynecology;  Laterality: N/A;   CESAREAN SECTION     x2   CHOLECYSTECTOMY     KIDNEY STONE SURGERY     LAPAROSCOPY  age 60   laser treatment for endometriosis  age 34   SALPINGOOPHORECTOMY Bilateral 08/03/2015   Procedure: BILATERAL SALPINGO OOPHORECTOMY;  Surgeon: Molli Posey, MD;  Location: [facility];  Service: Gynecology;  Laterality: Bilateral;    Family History  Problem Relation Age of Onset   Hypertension Father    Heart attack Father    Alcohol abuse Maternal Uncle    Breast cancer Maternal Grandmother    Cancer Paternal Grandfather        mesothelioma    Social History   Socioeconomic History   Marital status: Single    Spouse name: Not on file   Number of children: 2   Years of education: Not on file   Highest education level: Not on file  Occupational History    Employer: [employer]  Tobacco Use   Smoking status: Never   Smokeless tobacco: Never  Vaping Use   Vaping Use: Never used  Substance and Sexual Activity   Alcohol use: Yes    Comment: occasional   Drug use: No   Sexual activity: Not Currently    Birth control/protection: Surgical  Other Topics Concern   Not on file  Social History Narrative   Lives at home with her son  and daughter   Social Determinants of Health   Financial Resource Strain: Not on file  Food Insecurity: Not on file  Transportation Needs: Not on file  Physical Activity: Not on file  Stress: Not on file  Social Connections: Not on file  Intimate Partner Violence: Not on file    ROS      Objective    BP 102/70   Pulse 79   Ht 5\' 6"  (1.676 m)   Wt (!) 312 lb (141.5 kg)   LMP 07/21/2015   SpO2 95% Comment: on RA  BMI 50.36 kg/m   Physical Exam General: Alert, oriented.  No acute distress.  Obese female.  Appears stated age 67: PERRLA,  EOMI.,  Moist oral mucosa.  Normal tympanic membranes. CV: Regular rate and rhythm Pulmonary: Lungs clear bilaterally.  Stertorous breathing at rest. GI: Obese pannus, normal bowel sounds Extremities: No pedal edema.       Assessment & Plan:   Problem List Items Addressed This Visit       Musculoskeletal and Integument   Acne vulgaris - Primary    Erythema, comedones bilaterally. BenzaClin twice daily      Relevant Medications   clindamycin-benzoyl peroxide (BENZACLIN) gel     Other   Changes in vision    Intermittently blurred bilateral vision.  Unimproved by prescriptive lenses.  No decreased peripheral vision.  No double vision.  History of prediabetes.  Also has history of migraines, could be atypical/ocular migraine presentation.  Will refer to ophthalmology for retinopathy evaluation      Relevant Orders   Ambulatory referral to Ophthalmology   Encounter to establish care   Morbid obesity ([place])    BMI 50.  Refer to healthy weight and wellness.  Has a reported history of prediabetes. Follow-up lipid panel      Relevant Orders   Amb Ref to Medical Weight Management   Lipid Profile   PSG Sleep Study   Prediabetes    Follow-up A1c      Relevant Orders   Amb Ref to Medical Weight Management   HgB A1c   Snoring    Elevated risk for sleep apnea per STOP-BANG score.   - Referral for sleep study       Relevant Orders   PSG Sleep Study    Return in about 4 weeks (around 09/22/2022) for acne, weight.   Benay Pike, MD

## 2022-08-25 NOTE — Assessment & Plan Note (Signed)
-  Follow-up A1c 

## 2022-08-25 NOTE — Patient Instructions (Addendum)
It was nice to meet you today,   I will let you know the results of the labs when we get them.  I have referred you to healthy weight and wellness.   I will refer you for a sleep study.   I have prescribed a topical acne treatment to apply twice a day after you wash and dry your face.    Please f/u in 3-4 weeks.    Have a great day,   Dr. Jeannine Kitten

## 2022-08-25 NOTE — Assessment & Plan Note (Signed)
Intermittently blurred bilateral vision.  Unimproved by prescriptive lenses.  No decreased peripheral vision.  No double vision.  History of prediabetes.  Also has history of migraines, could be atypical/ocular migraine presentation.  Will refer to ophthalmology for retinopathy evaluation

## 2022-08-25 NOTE — Assessment & Plan Note (Signed)
Elevated risk for sleep apnea per STOP-BANG score.   - Referral for sleep study

## 2022-08-25 NOTE — Assessment & Plan Note (Signed)
BMI 50.  Refer to healthy weight and wellness.  Has a reported history of prediabetes. Follow-up lipid panel

## 2022-08-25 NOTE — Assessment & Plan Note (Signed)
Erythema, comedones bilaterally. BenzaClin twice daily

## 2022-08-26 ENCOUNTER — Other Ambulatory Visit (HOSPITAL_BASED_OUTPATIENT_CLINIC_OR_DEPARTMENT_OTHER): Payer: Self-pay

## 2022-08-26 ENCOUNTER — Other Ambulatory Visit: Payer: Self-pay | Admitting: Family Medicine

## 2022-08-26 LAB — LIPID PANEL
Chol/HDL Ratio: 2.7 ratio (ref 0.0–4.4)
Cholesterol, Total: 106 mg/dL (ref 100–199)
HDL: 39 mg/dL — ABNORMAL LOW (ref >39)
LDL Chol Calc (NIH): 49 mg/dL (ref 0–99)
Triglycerides: 93 mg/dL (ref 0–149)
VLDL Cholesterol Cal: 18 mg/dL (ref 5–40)

## 2022-08-26 LAB — HEMOGLOBIN A1C
Est. average glucose Bld gHb Est-mCnc: 128 mg/dL
Hgb A1c MFr Bld: 6.1 % — ABNORMAL HIGH (ref 4.8–5.6)

## 2022-08-30 ENCOUNTER — Telehealth: Payer: Self-pay | Admitting: *Deleted

## 2022-09-06 NOTE — Addendum Note (Signed)
Addended by: Lamonte Sakai, Wagner Tanzi D on: 09/06/2022 03:24 PM   Modules accepted: Orders

## 2022-09-07 NOTE — Telephone Encounter (Signed)
Pt called to say that she would like referral to go to [facility] in [place], [place].

## 2022-09-21 ENCOUNTER — Other Ambulatory Visit (HOSPITAL_BASED_OUTPATIENT_CLINIC_OR_DEPARTMENT_OTHER): Payer: Self-pay

## 2022-09-21 ENCOUNTER — Encounter: Payer: Self-pay | Admitting: Family Medicine

## 2022-09-21 ENCOUNTER — Ambulatory Visit (INDEPENDENT_AMBULATORY_CARE_PROVIDER_SITE_OTHER): Payer: Commercial Managed Care - PPO | Admitting: Family Medicine

## 2022-09-21 VITALS — BP 121/80 | HR 70 | Temp 98.2°F | Ht 66.0 in | Wt 314.0 lb

## 2022-09-21 DIAGNOSIS — F419 Anxiety disorder, unspecified: Secondary | ICD-10-CM | POA: Diagnosis not present

## 2022-09-21 DIAGNOSIS — F32A Depression, unspecified: Secondary | ICD-10-CM | POA: Diagnosis not present

## 2022-09-21 MED ORDER — ESCITALOPRAM OXALATE 10 MG PO TABS
10.0000 mg | ORAL_TABLET | Freq: Every day | ORAL | 1 refills | Status: DC
  Filled 2022-09-21: qty 30, 30d supply, fill #0

## 2022-09-21 NOTE — Progress Notes (Unsigned)
   Established Patient Office Visit  Subjective   Patient ID: [name], female    DOB: [birth year]  Age: 42 y.o. MRN: [id number]  Chief Complaint  Patient presents with   Follow Up Weight and Acne    Snoring-patient states she has her sleep study appointment scheduled for May 14  Blurred vision-patient still working on getting appointment scheduled for ophthalmology  Weight loss-patient has not yet heard from healthy weight and wellness.  Needs a number to call their office  Anxiety/depression-patient is feeling more depressed lately due to situations in her home life, although patient has been feeling depressed for several months at least.  Is more irritable.  Has difficulty sleeping.  Issues include relationship with her daughter, daughter's boyfriend, and her oldest son leaving for the [employer] soon.         ROS     Objective:     BP 121/80   Pulse 70   Temp 98.2 F (36.8 C) (Oral)   Ht  (1.676 m)   Wt (!) 314 lb (142.4 kg)   LMP 07/21/2015   SpO2 95%   BMI 50.68 kg/m    Physical Exam General: Alert and oriented CV: Regular rate rhythm Pulmonary: Lungs clear bilaterally Psych: Mildly depressed mood.  Becomes visibly more upset when discussion turns to her mood.   No results found for any visits on 09/21/22.    The ASCVD Risk score (Arnett DK, et al., 2019) failed to calculate for the following reasons:   The valid total cholesterol range is 130 to 320 mg/dL    Assessment & Plan:   Problem List Items Addressed This Visit       Other   Anxiety and depression - Primary       09/21/2022    3:28 PM 08/25/2022    3:15 PM 07/05/2021    3:53 PM 05/19/2021    1:21 PM  Depression screen PHQ 2/9  Decreased Interest 1 1 0 0  Down, Depressed, Hopeless 1 1 0 0  PHQ - 2 Score 2 2 0 0  Altered sleeping 2 3    Tired, decreased energy 2 3    Change in appetite 1 2    Feeling bad or failure about yourself  1 1    Trouble concentrating 1 1    Moving  slowly or fidgety/restless 0 0    Suicidal thoughts 1 0    PHQ-9 Score 10 12    Difficult doing work/chores Somewhat difficult Somewhat difficult    Patient open to therapy and medication. Patient has no active suicidal thoughts or plans.  Has feelings of things being better off if she was "gone" and thoughts of "just getting up and leaving" to get away from stress in her home. - Start Lexapro - Gave patient information on therapist and advised her to contact her insurance to see list of therapist who are approved by her insurance.      Relevant Medications   escitalopram (LEXAPRO) 10 MG tablet    Return in about 4 weeks (around 10/19/2022) for mood.    Sandre Kitty, MD

## 2022-09-21 NOTE — Patient Instructions (Signed)
   The number for the medical weight mgmt office is [phone]  Use the following website to look for threapists: [employer]dk.  Call your insurance to ask for a list of approved therapists and you can look them up using the above website.    Please follow up in 1 month with me.

## 2022-09-22 DIAGNOSIS — F419 Anxiety disorder, unspecified: Secondary | ICD-10-CM | POA: Insufficient documentation

## 2022-09-22 NOTE — Assessment & Plan Note (Signed)
    09/21/2022    3:28 PM 08/25/2022    3:15 PM 07/05/2021    3:53 PM 05/19/2021    1:21 PM  Depression screen PHQ 2/9  Decreased Interest 1 1 0 0  Down, Depressed, Hopeless 1 1 0 0  PHQ - 2 Score 2 2 0 0  Altered sleeping 2 3    Tired, decreased energy 2 3    Change in appetite 1 2    Feeling bad or failure about yourself  1 1    Trouble concentrating 1 1    Moving slowly or fidgety/restless 0 0    Suicidal thoughts 1 0    PHQ-9 Score 10 12    Difficult doing work/chores Somewhat difficult Somewhat difficult     Patient open to therapy and medication. Patient has no active suicidal thoughts or plans.  Has feelings of things being better off if she was "gone" and thoughts of "just getting up and leaving" to get away from stress in her home. - Start Lexapro - Gave patient information on therapist and advised her to contact her insurance to see list of therapist who are approved by her insurance.

## 2022-10-11 ENCOUNTER — Institutional Professional Consult (permissible substitution): Payer: Commercial Managed Care - PPO | Admitting: Neurology

## 2022-10-12 ENCOUNTER — Encounter: Payer: Self-pay | Admitting: Neurology

## 2022-10-12 ENCOUNTER — Ambulatory Visit: Payer: Commercial Managed Care - PPO | Admitting: Neurology

## 2022-10-12 VITALS — BP 108/67 | HR 78 | Ht 66.0 in | Wt 314.0 lb

## 2022-10-12 DIAGNOSIS — G4733 Obstructive sleep apnea (adult) (pediatric): Secondary | ICD-10-CM | POA: Diagnosis not present

## 2022-10-12 DIAGNOSIS — G4726 Circadian rhythm sleep disorder, shift work type: Secondary | ICD-10-CM | POA: Diagnosis not present

## 2022-10-12 DIAGNOSIS — R519 Headache, unspecified: Secondary | ICD-10-CM | POA: Diagnosis not present

## 2022-10-12 DIAGNOSIS — Z820 Family history of epilepsy and other diseases of the nervous system: Secondary | ICD-10-CM | POA: Diagnosis not present

## 2022-10-12 DIAGNOSIS — R351 Nocturia: Secondary | ICD-10-CM | POA: Diagnosis not present

## 2022-10-12 DIAGNOSIS — Z6841 Body Mass Index (BMI) 40.0 and over, adult: Secondary | ICD-10-CM | POA: Diagnosis not present

## 2022-10-12 DIAGNOSIS — R400 Somnolence: Secondary | ICD-10-CM | POA: Diagnosis not present

## 2022-10-12 NOTE — Progress Notes (Signed)
Subjective:    Patient ID: [name] is a 42 y.o. female.  HPI    [name], MD, PhD [facility] [address] [address] [place], [place] [address]  Dear Dr. Constance Goltz,  I saw your patient, [name], upon your kind request in my sleep clinic today for initial consultation of her sleep disorder, in particular, evaluation of her prior Dx of obstructive sleep apnea.  The patient is unaccompanied today.  As you know, [name] is a 42 year old female with an underlying medical history of prediabetes, allergies, asthma, endometriosis, hypertension, history of kidney stones, and severe obesity with a BMI of over 50, who reports snoring and excessive daytime somnolence as well as morning headaches.  Her Epworth sleepiness score is 22 out of 24, fatigue severity score is 61 out of 63.  She works third shift, she works at the lab at [employer].  She works at Thursday through Sunday nights, from 8 PM to 6:30 AM.  During the work week her bedtime is between 7 and 8 AM and rise time between 2 and 3 PM.  When she is off, she will try to sleep overnight.  She has undergone a home sleep test in the past, but reported initially that she would like to come in for an in lab test because she did not do well with a home sleep test, sleep was interrupted, she woke up in a panic, she has multiple pets in the house which interrupted her sleep.  She lives with her son and her daughter.  Her son is getting ready to join the [employer], her daughter has a small child.  Patient does not watch TV in her bedroom, she recalls that she had oxygen desaturations when she was hospitalized in the recent past, within the past year.  She has been very sleepy at the wheel, admits that she has come close or even dozed off at the wheel and even at work when sedentary, her sister works in the same lab.  She does have a 30-minute commute.  She is worried about not being able to use CPAP because of  nasal congestion but is open to trying anything at this point.  She has nocturia about 2-3 times per average night and has recurrent morning headaches which are typically dull and achy and not like her migraines, she has had migraines before, she has nearly daily morning headaches.  She has had occasional enuresis as well.  She is working on weight loss but weight has plateaued.  She drinks caffeine in the form of soda, 1 or 2/day.  She does not drink any alcohol.  She is a non-smoker.  She has multiple animals at the house, 9 dogs and 2 cats, the cats typically stay in her daughter's room.  Almost all of her small dogs sleep with her on the bed.  She reports a history of sleep apnea affecting her father, paternal aunt and several cousins on both sides of the family. I reviewed your office note from 08/25/2022. Of note, she was previously diagnosed with mild obstructive sleep apnea through pulmonology.  She was seen by Helene Kelp, PA with [facility] pulmonology in 2021 and I reviewed the records in epic.  She had a home sleep test on 10/22/2019 which indicated an AHI of 6.1/h.  She was advised to start on AutoPap therapy at home.  She had a follow-up visit on 11/05/2019. She did not end up starting autoPAP, as  insurance would not cover it.   Her Past Medical History Is Significant For: Past Medical History:  Diagnosis Date   Allergy-induced asthma    COVID-19    2020   Endometriosis    Endometriosis    Headache    occasional migraines   Hypertension 01/2002   with pregnancy   Kidney stones    Pneumonia    2008   PONV (postoperative nausea and vomiting)    nausea after c-section   Prediabetes     Her Past Surgical History Is Significant For: Past Surgical History:  Procedure Laterality Date   ABDOMINAL HYSTERECTOMY N/A 08/03/2015   Procedure: HYSTERECTOMY ABDOMINAL;  Surgeon: Richarda Overlie, MD;  Location: [facility];  Service: Gynecology;  Laterality: N/A;   CESAREAN SECTION     x2    CHOLECYSTECTOMY     KIDNEY STONE SURGERY     LAPAROSCOPY  age 17   laser treatment for endometriosis  age 31   SALPINGOOPHORECTOMY Bilateral 08/03/2015   Procedure: BILATERAL SALPINGO OOPHORECTOMY;  Surgeon: Richarda Overlie, MD;  Location: [facility];  Service: Gynecology;  Laterality: Bilateral;    Her Family History Is Significant For: Family History  Problem Relation Age of Onset   Hypertension Father    Heart attack Father    Sleep apnea Father    Alcohol abuse Maternal Uncle    Sleep apnea Paternal Aunt    Sleep apnea Paternal Uncle    Breast cancer Maternal Grandmother    Cancer Paternal Grandfather        mesothelioma   Sleep apnea Cousin     Her Social History Is Significant For: Social History   Socioeconomic History   Marital status: Single    Spouse name: Not on file   Number of children: 2   Years of education: Not on file   Highest education level: Not on file  Occupational History    Employer: [employer]  Tobacco Use   Smoking status: Never   Smokeless tobacco: Never  Vaping Use   Vaping Use: Never used  Substance and Sexual Activity   Alcohol use: Yes    Comment: occasional   Drug use: No   Sexual activity: Not Currently    Birth control/protection: Surgical  Other Topics Concern   Not on file  Social History Narrative   Lives at home with her son and daughter   Social Determinants of Health   Financial Resource Strain: Not on file  Food Insecurity: Not on file  Transportation Needs: Not on file  Physical Activity: Not on file  Stress: Not on file  Social Connections: Not on file    Her Allergies Are:  Allergies  Allergen Reactions   Latex Rash   Sulfa Antibiotics Rash  :   Her Current Medications Are:  Outpatient Encounter Medications as of 10/12/2022  Medication Sig   albuterol (PROAIR HFA) 108 (90 Base) MCG/ACT inhaler Inhale 2 puffs into the lungs every 6 (six) hours as needed for wheezing or shortness of breath. (Patient  taking differently: Inhale 2 puffs into the lungs as needed for wheezing or shortness of breath.)   albuterol (PROVENTIL) (2.5 MG/3ML) 0.083% nebulizer solution Take 3 mLs (2.5 mg total) by nebulization every 6 (six) hours as needed for wheezing or shortness of breath. (Patient taking differently: Take 2.5 mg by nebulization as needed for wheezing or shortness of breath.)   budesonide-formoterol (SYMBICORT) 80-4.5 MCG/ACT inhaler Inhale 2 puffs into the lungs 2 (two) times daily. (Patient  taking differently: Inhale 2 puffs into the lungs as needed.)   escitalopram (LEXAPRO) 10 MG tablet Take 1 tablet (10 mg total) by mouth daily.   ibuprofen (ADVIL) 600 MG tablet Take 1 tablet (600 mg total) by mouth every 8 (eight) hours as needed. Take with food. (Patient taking differently: Take 600 mg by mouth as needed. Take with food.)   nystatin cream (MYCOSTATIN) Apply 1 application topically 2 (two) times daily. (Patient taking differently: Apply 1 application  topically as needed.)   triamcinolone cream (KENALOG) 0.1 % Apply 1 application topically 2 (two) times daily. (Patient taking differently: Apply 1 application  topically as needed.)   clindamycin-benzoyl peroxide (BENZACLIN) gel Apply topically 2 (two) times daily.   doxycycline (VIBRAMYCIN) 100 MG capsule Take 1 capsule (100 mg total) by mouth 2 (two) times daily.   estradiol (ESTRACE) 2 MG tablet Take 1 tablet (2 mg total) by mouth daily.   montelukast (SINGULAIR) 10 MG tablet Take 1 tablet (10 mg total) by mouth at bedtime.   Vitamin D, Ergocalciferol, (DRISDOL) 1.25 MG (50000 UNIT) CAPS capsule Take 1 capsule (50,000 Units total) by mouth every 7 (seven) days.   No facility-administered encounter medications on file as of 10/12/2022.  :   Review of Systems:  Out of a complete 14 point review of systems, all are reviewed and negative with the exception of these symptoms as listed below:  Review of Systems  Neurological:        Pt here for  sleep consult Pt snores.fatigue,headaches  Pt denies hypertension,CPAP machine . Pt states HST  2 years ago . Pt states didn't proceed with CPAP machine due to insurance    ESS:22 FSS:61    Objective:  Neurological Exam  Physical Exam Physical Examination:   Vitals:   10/12/22 0915  BP: 108/67  Pulse: 78    General Examination: The patient is a very pleasant 42 y.o. female in no acute distress. She appears well-developed and well-nourished and well groomed.   HEENT: Normocephalic, atraumatic, pupils are equal, round and reactive to light, extraocular tracking is good without limitation to gaze excursion or nystagmus noted. Hearing is grossly intact. Face is symmetric with normal facial animation. Speech is clear with no dysarthria noted. There is no hypophonia. There is no lip, neck/head, jaw or voice tremor. Neck is supple with full range of passive and active motion. There are no carotid bruits on auscultation. Oropharynx exam reveals: mild mouth dryness, adequate dental hygiene and moderate airway crowding, due to small airway entry, tonsils small, Mallampati class IV.  Uvula not fully visualized.  Tongue protrudes centrally and palate elevates symmetrically, neck circumference 17-1/4 inches, moderate to significant overbite noted.  Chest: Clear to auscultation without wheezing, rhonchi or crackles noted.  Heart: S1+S2+0, regular and normal without murmurs, rubs or gallops noted.   Abdomen: Soft, non-tender and non-distended.  Extremities: There is puffiness noted in both ankles.   Skin: Warm and dry without trophic changes noted.   Musculoskeletal: exam reveals no obvious joint deformities.   Neurologically:  Mental status: The patient is awake, alert and oriented in all 4 spheres. Her immediate and remote memory, attention, language skills and fund of knowledge are appropriate. There is no evidence of aphasia, agnosia, apraxia or anomia. Speech is clear with normal prosody  and enunciation. Thought process is linear. Mood is normal and affect is normal.  Cranial nerves II - XII are as described above under HEENT exam.  Motor exam: Normal bulk, strength and  tone is noted. There is no obvious action or resting tremor.  Fine motor skills and coordination: grossly intact.  Cerebellar testing: No dysmetria or intention tremor. There is no truncal or gait ataxia.  Sensory exam: intact to light touch in the upper and lower extremities.  Gait, station and balance: She stands easily. No veering to one side is noted. No leaning to one side is noted. Posture is age-appropriate and stance is narrow based. Gait shows normal stride length and normal pace. No problems turning are noted.   Assessment and plan:  In summary, BARBRA CASUCCI is a very pleasant 42 y.o.-year old female with an underlying medical history of prediabetes, allergies, asthma, endometriosis, hypertension, history of kidney stones, and severe obesity with a BMI of over 50, whose history and physical exam are concerning for sleep disordered breathing, particularly obstructive sleep apnea (OSA).  While a laboratory attended sleep study is typically considered "gold standard" for evaluation of sleep disordered breathing, we mutually agreed to pursue a home sleep test at this time, in the interest of getting her evaluated sooner rather than later and getting her hopefully on treatment sooner.  She is strongly advised not to drive when feeling sleepy and highly cautioned against driving or using heavy machinery at this point in time.  Switching from nighttime to daytime sleep and vice versa adds to sleep disturbance in her case.  She is considering this trying to switch to daytime hours again.  I had a long chat with the patient about my findings and the diagnosis of sleep apnea, particularly OSA, its prognosis and treatment options. We talked about medical/conservative treatments, surgical interventions and  non-pharmacological approaches for symptom control. I explained, in particular, the risks and ramifications of untreated moderate to severe OSA, especially with respect to developing cardiovascular disease down the road, including congestive heart failure (CHF), difficult to treat hypertension, cardiac arrhythmias (particularly A-fib), neurovascular complications including TIA, stroke and dementia. Even type 2 diabetes has, in part, been linked to untreated OSA. Symptoms of untreated OSA may include (but may not be limited to) daytime sleepiness, nocturia (i.e. frequent nighttime urination), memory problems, mood irritability and suboptimally controlled or worsening mood disorder such as depression and/or anxiety, lack of energy, lack of motivation, physical discomfort, as well as recurrent headaches, especially morning or nocturnal headaches. We talked about the importance of maintaining a healthy lifestyle and striving for healthy weight.  In addition, we talked about the importance of striving for and maintaining good sleep hygiene. I recommended a sleep study at this time. I outlined the differences between a laboratory attended sleep study which is considered more comprehensive and accurate over the option of a home sleep test (HST); the latter may lead to underestimation of sleep disordered breathing in some instances and does not help with diagnosing upper airway resistance syndrome and is not accurate enough to diagnose primary central sleep apnea typically. I outlined possible surgical and non-surgical treatment options of OSA, including the use of a positive airway pressure (PAP) device (i.e. CPAP, AutoPAP/APAP or BiPAP in certain circumstances), a custom-made dental device (aka oral appliance, which would require a referral to a specialist dentist or orthodontist typically, and is generally speaking not considered for patients with full dentures or edentulous state), upper airway surgical options, such  as traditional UPPP (which is not considered a first-line treatment) or the Inspire device (hypoglossal nerve stimulator, which would involve a referral for consultation with an ENT surgeon, after careful selection, following inclusion criteria -  also not first-line treatment). I explained the PAP treatment option to the patient in detail, as this is generally considered first-line treatment.  The patient indicated that she would be willing to try PAP therapy, if the need arises. I explained the importance of being compliant with PAP treatment, not only for insurance purposes but primarily to improve patient's symptoms symptoms, and for the patient's long term health benefit, including to reduce Her cardiovascular risks longer-term.    We will pick up our discussion about the next steps and treatment options after testing.  We will keep her posted as to the test results by phone call and/or MyChart messaging where possible.  We will plan to follow-up in sleep clinic accordingly as well.  I answered all her questions today and the patient was in agreement.   I encouraged her to call with any interim questions, concerns, problems or updates or email [place] through [facility].  Generally speaking, sleep test authorizations may take up to 2 weeks, sometimes less, sometimes longer, the patient is encouraged to get in touch with [place] if they do not hear back from the sleep lab staff directly within the next 2 weeks.  Thank you very much for allowing me to participate in the care of this nice patient. If I can be of any further assistance to you please do not hesitate to call me at [phone].  Sincerely,   Star Age, MD, PhD

## 2022-10-12 NOTE — Patient Instructions (Addendum)
Thank you for choosing [facility] for your sleep related care! It was nice to meet you today!   Here is what we discussed today:    Based on your symptoms and your exam I believe you are still at risk for obstructive sleep apnea (aka OSA). We should proceed with a sleep study to determine whether you do or do not have OSA and how severe it is. Even, if you have mild OSA, I may want you to consider treatment with CPAP, as treatment of even borderline or mild sleep apnea can result and improvement of symptoms such as sleep disruption, daytime sleepiness, nighttime bathroom breaks, restless leg symptoms, improvement of headache syndromes, even improved mood disorder.   As explained, an attended sleep study (meaning you get to stay overnight in the sleep lab), lets us monitor sleep-related behaviors such as sleep talking and leg movements in sleep, in addition to monitoring for sleep apnea.  A home sleep test is a screening tool for sleep apnea diagnosis only, but unfortunately, does not help with any other sleep-related diagnoses.  Please remember, the long-term risks and ramifications of untreated moderate to severe obstructive sleep apnea may include (but are not limited to): increased risk for cardiovascular disease, including congestive heart failure, stroke, difficult to control hypertension, treatment resistant obesity, arrhythmias, especially irregular heartbeat commonly known as A. Fib. (atrial fibrillation); even type 2 diabetes has been linked to untreated OSA.   Other correlations that untreated obstructive sleep apnea include macular edema which is swelling of the retina in the eyes, droopy eyelid syndrome, and elevated hemoglobin and hematocrit levels (often referred to as polycythemia).  Sleep apnea can cause disruption of sleep and sleep deprivation in most cases, which, in turn, can cause recurrent headaches, problems with memory, mood, concentration, focus, and  vigilance. Most people with untreated sleep apnea report excessive daytime sleepiness, which can affect their ability to drive. Please do not drive or use heavy equipment or machinery, if you feel sleepy! Patients with sleep apnea can also develop difficulty initiating and maintaining sleep (aka insomnia).   Having sleep apnea may increase your risk for other sleep disorders, including involuntary behaviors sleep such as sleep terrors, sleep talking, sleepwalking.    Having sleep apnea can also increase your risk for restless leg syndrome and leg movements at night.   Please note that untreated obstructive sleep apnea may carry additional perioperative morbidity. Patients with significant obstructive sleep apnea (typically, in the moderate to severe degree) should receive, if possible, perioperative PAP (positive airway pressure) therapy and the surgeons and particularly the anesthesiologists should be informed of the diagnosis and the severity of the sleep disordered breathing.   We will call you or email you through MyChart with regards to your test results and plan a follow-up in sleep clinic accordingly. Most likely, you will hear from one of our nurses.   Our sleep lab administrative assistant will call you to schedule your sleep study and give you further instructions, regarding the check in process for the sleep study, arrival time, what to bring, when you can expect to leave after the study, etc., and to answer any other logistical questions you may have. If you don't hear back from her by about 2 weeks from now, please feel free to call her direct line at [phone] or you can call our general clinic number, or email us through My Chart.   

## 2022-10-18 ENCOUNTER — Telehealth: Payer: Self-pay | Admitting: Neurology

## 2022-10-18 NOTE — Telephone Encounter (Signed)
5/21:lvm-mla 10/12/22 [facility] aetna no auth req EE

## 2022-10-19 ENCOUNTER — Ambulatory Visit: Payer: Commercial Managed Care - PPO | Admitting: Family Medicine

## 2022-10-19 NOTE — Progress Notes (Deleted)
   Established Patient Office Visit  Subjective   Patient ID: [name], female    DOB: 03/26/1981  Age: 42 y.o. MRN: [id number]  No chief complaint on file.   HPI  Anxiety and depression - lexapro? Therapy?   Optho?    [facility]?   Sleep study?   {History (Optional):23778}  ROS    Objective:     LMP 07/21/2015  {Vitals History (Optional):23777}  Physical Exam   No results found for any visits on 10/19/22.  {Labs (Optional):23779}  The ASCVD Risk score (Arnett DK, et al., 2019) failed to calculate for the following reasons:   The valid total cholesterol range is 130 to 320 mg/dL    Assessment & Plan:   Problem List Items Addressed This Visit   None   No follow-ups on file.    Sandre Kitty, MD

## 2022-10-27 ENCOUNTER — Encounter: Payer: Self-pay | Admitting: Family Medicine

## 2022-10-27 ENCOUNTER — Other Ambulatory Visit (HOSPITAL_COMMUNITY): Payer: Self-pay

## 2022-10-27 ENCOUNTER — Ambulatory Visit (INDEPENDENT_AMBULATORY_CARE_PROVIDER_SITE_OTHER): Payer: Commercial Managed Care - PPO | Admitting: Family Medicine

## 2022-10-27 VITALS — BP 99/65 | HR 76 | Temp 97.9°F | Ht 66.0 in | Wt 313.8 lb

## 2022-10-27 DIAGNOSIS — M25552 Pain in left hip: Secondary | ICD-10-CM | POA: Insufficient documentation

## 2022-10-27 MED ORDER — MELOXICAM 15 MG PO TABS
15.0000 mg | ORAL_TABLET | Freq: Every day | ORAL | 0 refills | Status: DC
  Filled 2022-10-27: qty 30, 30d supply, fill #0

## 2022-10-27 NOTE — Assessment & Plan Note (Signed)
2 weeks of sudden onset of progressively worsening left hip pain with no inciting trauma.  Exam positive for tenderness over the greater trochanter as well as sacrum.  Also positive for pain with internal and external rotation of the hip.  Will get x-ray of hip and femur rule out bony pathology.  Will refer to sports medicine for further evaluation.  Advised heat, rest, Tylenol and meloxicam.  Differential includes greater trochanteric pain syndrome, osteoarthritis, femoral acetabular impingement, labral tear.

## 2022-10-27 NOTE — Progress Notes (Signed)
   Acute Office Visit  Subjective:     Patient ID: [name], female    DOB: [birth year], 42 y.o.   MRN: [id number]  Chief Complaint  Patient presents with   Leg Problem    HPI Patient is in today for complaint of left hip pain that started suddenly approximately 2 weeks ago.  Began after she woke up.  Does not recall injuring it prior to this.  More painful if she is sitting, especially riding in a car.  Pain improves with walking.  Describes it as a dull aching pain but her thigh she describes as a sharp pain as if "muscles being pulled off the bone".  Right side does not hurt.  Takes Tylenol or Aleve not sure if this helps.  Pain and difficulty ambulating is getting progressively worse.   ROS      Objective:    BP 99/65   Pulse 76   Temp 97.9 F (36.6 C) (Oral)   Ht 5\' 6"  (1.676 m)   Wt (!) 313 lb 12 oz (142.3 kg)   LMP 07/21/2015   SpO2 96%   BMI 50.64 kg/m    Physical Exam General: Alert and oriented CV: Regular rate and rhythm MSK: Tender to palpation over the left greater trochanter and the sacrum.  Pain with internal and external hip rotation, worse with external rotation.  Positive Pearlean Brownie and FADIR test.  Negative straight leg test  No results found for any visits on 10/27/22.      Assessment & Plan:   Problem List Items Addressed This Visit       Other   Left hip pain - Primary    2 weeks of sudden onset of progressively worsening left hip pain with no inciting trauma.  Exam positive for tenderness over the greater trochanter as well as sacrum.  Also positive for pain with internal and external rotation of the hip.  Will get x-ray of hip and femur rule out bony pathology.  Will refer to sports medicine for further evaluation.  Advised heat, rest, Tylenol and meloxicam.  Differential includes greater trochanteric pain syndrome, osteoarthritis, femoral acetabular impingement, labral tear.      Relevant Orders   DG Hip Unilat W OR W/O Pelvis 2-3 Views  Right   DG FEMUR 1V LEFT   Ambulatory referral to Sports Medicine    Meds ordered this encounter  Medications   meloxicam (MOBIC) 15 MG tablet    Sig: Take 1 tablet (15 mg total) by mouth daily.    Dispense:  30 tablet    Refill:  0    Return in about 4 weeks (around 11/24/2022) for Hip pain.  Sandre Kitty, MD

## 2022-10-27 NOTE — Patient Instructions (Signed)
It was nice to see you today,  Your pain in your hip can be caused by a few different things including arthritis, stress fracture, labral tear, among other things.  I would like to get a x-ray to rule out any skeletal causes.  I will also refer you to sports medicine clinic for further evaluation and treatment.  Sometimes these things can take 4 to 6 weeks for them to resolve.  I have prescribed to a anti-inflammatory pain medication to take once a day called meloxicam.  In addition to this you can take Tylenol as needed.  You can take 1000 mg of Tylenol every 8 hours.  I would also like you to use a heating pad for 15 to 20 minutes at a time as needed throughout the day.  Have a great day,  Frederic Jericho, MD

## 2022-10-28 ENCOUNTER — Other Ambulatory Visit: Payer: Self-pay | Admitting: Family Medicine

## 2022-10-28 ENCOUNTER — Other Ambulatory Visit (HOSPITAL_COMMUNITY): Payer: Self-pay

## 2022-10-28 ENCOUNTER — Ambulatory Visit
Admission: RE | Admit: 2022-10-28 | Discharge: 2022-10-28 | Disposition: A | Discharge status: Home / Self Care | Payer: Commercial Managed Care - PPO | Source: Ambulatory Visit | Attending: Family Medicine | Admitting: Family Medicine

## 2022-10-28 DIAGNOSIS — M79605 Pain in left leg: Secondary | ICD-10-CM | POA: Diagnosis not present

## 2022-10-28 DIAGNOSIS — M25552 Pain in left hip: Secondary | ICD-10-CM

## 2022-11-01 ENCOUNTER — Ambulatory Visit (INDEPENDENT_AMBULATORY_CARE_PROVIDER_SITE_OTHER): Payer: Commercial Managed Care - PPO | Admitting: Neurology

## 2022-11-01 DIAGNOSIS — G4726 Circadian rhythm sleep disorder, shift work type: Secondary | ICD-10-CM

## 2022-11-01 DIAGNOSIS — G4733 Obstructive sleep apnea (adult) (pediatric): Secondary | ICD-10-CM

## 2022-11-01 DIAGNOSIS — R400 Somnolence: Secondary | ICD-10-CM

## 2022-11-01 DIAGNOSIS — Z820 Family history of epilepsy and other diseases of the nervous system: Secondary | ICD-10-CM

## 2022-11-01 DIAGNOSIS — R519 Headache, unspecified: Secondary | ICD-10-CM

## 2022-11-01 DIAGNOSIS — Z6841 Body Mass Index (BMI) 40.0 and over, adult: Secondary | ICD-10-CM

## 2022-11-01 DIAGNOSIS — R351 Nocturia: Secondary | ICD-10-CM

## 2022-11-01 DIAGNOSIS — G4734 Idiopathic sleep related nonobstructive alveolar hypoventilation: Secondary | ICD-10-CM

## 2022-11-02 ENCOUNTER — Ambulatory Visit: Payer: Commercial Managed Care - PPO | Admitting: Sports Medicine

## 2022-11-02 VITALS — BP 136/65 | Ht 66.0 in | Wt 312.0 lb

## 2022-11-02 DIAGNOSIS — M7062 Trochanteric bursitis, left hip: Secondary | ICD-10-CM

## 2022-11-02 MED ORDER — METHYLPREDNISOLONE ACETATE 40 MG/ML IJ SUSP
40.0000 mg | Freq: Once | INTRAMUSCULAR | Status: AC
  Administered 2022-11-02: 40 mg via INTRA_ARTICULAR

## 2022-11-02 NOTE — Progress Notes (Signed)
PCP: Sandre Kitty, MD  Subjective:   HPI: Patient is a 42 y.o. female with PMH prediabetes, anxiety and depression here for left hip pain.  She started having pain in her lateral left hip about 2 weeks ago.  She says that she was getting out of bed when it started.  No preceding trauma or significant/abnormal exertion.  Pain is mostly sore in nature.  Radiates down her lateral and anterior thigh.  Is worse when she is sitting, especially when she is in the car for a while.  Her PCP prescribed meloxicam which seems to be helping somewhat.  Has some numbness in her anterior thigh as well.  Some weakness in her left leg but not significant and still ambulatory.  She has not tried PT.  Past Medical History:  Diagnosis Date   Allergy-induced asthma    COVID-19    2020   Endometriosis    Endometriosis    Headache    occasional migraines   Hypertension 01/2002   with pregnancy   Kidney stones    Pneumonia    2008   PONV (postoperative nausea and vomiting)    nausea after c-section   Prediabetes     Current Outpatient Medications on File Prior to Visit  Medication Sig Dispense Refill   albuterol (PROAIR HFA) 108 (90 Base) MCG/ACT inhaler Inhale 2 puffs into the lungs every 6 (six) hours as needed for wheezing or shortness of breath. (Patient taking differently: Inhale 2 puffs into the lungs as needed for wheezing or shortness of breath.) 8.5 g 2   albuterol (PROVENTIL) (2.5 MG/3ML) 0.083% nebulizer solution Take 3 mLs (2.5 mg total) by nebulization every 6 (six) hours as needed for wheezing or shortness of breath. (Patient taking differently: Take 2.5 mg by nebulization as needed for wheezing or shortness of breath.) 150 mL 1   budesonide-formoterol (SYMBICORT) 80-4.5 MCG/ACT inhaler Inhale 2 puffs into the lungs 2 (two) times daily. (Patient taking differently: Inhale 2 puffs into the lungs as needed.) 10.2 g 12   clindamycin-benzoyl peroxide (BENZACLIN) gel Apply topically 2 (two) times  daily. 25 g 0   doxycycline (VIBRAMYCIN) 100 MG capsule Take 1 capsule (100 mg total) by mouth 2 (two) times daily. 20 capsule 0   escitalopram (LEXAPRO) 10 MG tablet Take 1 tablet (10 mg total) by mouth daily. 30 tablet 1   estradiol (ESTRACE) 2 MG tablet Take 1 tablet (2 mg total) by mouth daily. 90 tablet 1   ibuprofen (ADVIL) 600 MG tablet Take 1 tablet (600 mg total) by mouth every 8 (eight) hours as needed. Take with food. (Patient taking differently: Take 600 mg by mouth as needed. Take with food.) 15 tablet 0   meloxicam (MOBIC) 15 MG tablet Take 1 tablet (15 mg total) by mouth daily. 30 tablet 0   montelukast (SINGULAIR) 10 MG tablet Take 1 tablet (10 mg total) by mouth at bedtime. 90 tablet 1   nystatin cream (MYCOSTATIN) Apply 1 application topically 2 (two) times daily. (Patient taking differently: Apply 1 application  topically as needed.) 30 g 2   triamcinolone cream (KENALOG) 0.1 % Apply 1 application topically 2 (two) times daily. (Patient taking differently: Apply 1 application  topically as needed.) 30 g 0   Vitamin D, Ergocalciferol, (DRISDOL) 1.25 MG (50000 UNIT) CAPS capsule Take 1 capsule (50,000 Units total) by mouth every 7 (seven) days. 12 capsule 0   No current facility-administered medications on file prior to visit.    Past Surgical  History:  Procedure Laterality Date   ABDOMINAL HYSTERECTOMY N/A 08/03/2015   Procedure: HYSTERECTOMY ABDOMINAL;  Surgeon: Richarda Overlie, MD;  Location: [facility];  Service: Gynecology;  Laterality: N/A;   CESAREAN SECTION     x2   CHOLECYSTECTOMY     KIDNEY STONE SURGERY     LAPAROSCOPY  age 29   laser treatment for endometriosis  age 59   SALPINGOOPHORECTOMY Bilateral 08/03/2015   Procedure: BILATERAL SALPINGO OOPHORECTOMY;  Surgeon: Richarda Overlie, MD;  Location: [facility];  Service: Gynecology;  Laterality: Bilateral;    Allergies  Allergen Reactions   Latex Rash   Sulfa Antibiotics Rash    BP (!) 141/70   Ht 5\' 6"  (1.676 m)    Wt (!) 312 lb (141.5 kg)   LMP 07/21/2015   BMI 50.36 kg/m       No data to display              No data to display              Objective:  Physical Exam:  Gen: NAD, comfortable in exam room  Right hip/leg: No clear injury or deformity.  No ecchymoses.  Full active and passive range of motion.  5/5 strength hip flexion, knee flexion and extension, plantar and dorsiflexion.  1+ plantar and Achilles tendon reflexes.  Negative straight leg raise  Left hip/leg: No clear injury or deformity.  No ecchymoses.  Full active and passive range of motion.  Significant TTP over greater trochanter.  Hip flexion, knee flexion and extension, plantarflexion slightly weaker on left compared to right.  1+ plantar and Achilles tendon reflexes.  Normal gait.  Negative straight leg raise  Procedures: Greater trochanter steroid injection Procedure performed: Greater trochanter intraarticular corticosteroid injection    Consent obtained and verified.  Discussed benefits and alternatives and risks including infection, bleeding, damage to surrounding structures. Time-out conducted. Noted no overlying erythema, induration, or other signs of local infection. The greater trochanter was identified using bony landmarks.  Injection site was prepared using alcohol wipes.  1 mL of 40 mg/ML methylprednisolone and 4 mL 1% lidocaine mix was injected using spinal needle. Completed without difficulty.  Advised to call if fevers/chills, erythema, induration, drainage, or persistent bleeding.     Assessment & Plan:  1.  Left hip pain Presents with significant tenderness palpation over the greater trochanter and symptoms consistent with trochanteric bursitis/greater trochanteric syndrome.  Steroid injection performed today.  Provided exercises to perform at home.  Can continue meloxicam.  Follow-up in 4 weeks if needed.   FELLOW ATTESTATION: I personally evaluated the patient with the resident and agree with  the above documentation with the following emphasis: Left lateral hip pain.  Tender to palpation of the greater trochanter.  Greater trochanteric bursitis injection performed today.  Continue meloxicam.  Home exercises given.  Follow-up in 4 weeks if no improvement.  Baldemar Friday Rafoth 11/02/2022

## 2022-11-10 NOTE — Procedures (Signed)
[facility]  HOME SLEEP TEST (Watch PAT) REPORT - Mail-out Device  STUDY DATE: 11/08/2022  DOB: 08-20-1980  MRN: [id number]  ORDERING CLINICIAN: [name], MD, PhD   REFERRING CLINICIAN: Sandre Kitty, MD   CLINICAL INFORMATION/HISTORY: 42 year old female with an underlying medical history of prediabetes, allergies, asthma, endometriosis, hypertension, history of kidney stones, and severe obesity with a BMI of over 50, who reports snoring and excessive daytime somnolence as well as morning headaches.  She had a home sleep test in 2021 which indicated overall mild sleep apnea.  She has not been on PAP therapy.  Epworth sleepiness score: 22/24.  BMI: 50.7 kg/m  FINDINGS:   Sleep Summary:   Total Recording Time (hours, min): 9 hours, 59 min  Total Sleep Time (hours, min):  8 hours, 36 min  Percent REM (%):    24%   Respiratory Indices:   Calculated pAHI (per hour):  36.1/hour         REM pAHI:    52.9/hour       NREM pAHI: 30.7/hour  Central pAHI: 1/hour  Oxygen Saturation Statistics:    Oxygen Saturation (%) Mean: 92%   Minimum oxygen saturation (%):                 77%   O2 Saturation Range (%): 77 - 99%    O2 Saturation (minutes) <=88%: 46.5 min  Pulse Rate Statistics:   Pulse Mean (bpm):    65/min    Pulse Range (41 - 116/min)   IMPRESSION: OSA (obstructive sleep apnea), severe Nocturnal Hypoxemia  RECOMMENDATION:  This home sleep test demonstrates severe obstructive sleep apnea with a total AHI of 36.1/hour and O2 nadir of 77%, with significant time below or at 88% saturation of over 45 minutes, indicating nocturnal hypoxemia.  Snoring was detected, in the moderate to loud range, rarely in the mild range.  Treatment with positive airway pressure is highly recommended. The patient will be advised to proceed with an autoPAP titration/trial at home. A laboratory attended titration study can be considered in the future for  optimization of treatment settings and to improve tolerance and compliance. Alternative treatment options are limited secondary to the severity of the patient's sleep disordered breathing, but may include surgical treatment with an implantable hypoglossal nerve stimulator (in carefully selected candidates, meeting criteria).  Concomitant weight loss is recommended, where clinically appropriate. Please note, that untreated obstructive sleep apnea may carry additional perioperative morbidity. Patients with significant obstructive sleep apnea should receive perioperative PAP therapy and the surgeons and particularly the anesthesiologist should be informed of the diagnosis and the severity of the sleep disordered breathing. The patient should be cautioned not to drive, work at heights, or operate dangerous or heavy equipment when tired or sleepy. Review and reiteration of good sleep hygiene measures should be pursued with any patient. Other causes of the patient's symptoms, including circadian rhythm disturbances, an underlying mood disorder, medication effect and/or an underlying medical problem cannot be ruled out based on this test. Clinical correlation is recommended.  The patient and her referring provider will be notified of the test results. The patient will be seen in follow up in sleep clinic at [facility].  I certify that I have reviewed the raw data recording prior to the issuance of this report in accordance with the standards of the American Academy of Sleep Medicine (AASM).  INTERPRETING PHYSICIAN:   [name], MD, PhD Medical Director, [facility]) Diplomat,  ABPN (Neurology and Sleep)   [facility] [address] [place], [place] [address] [phone]

## 2022-11-10 NOTE — Addendum Note (Signed)
Addended by: [name] on: 11/10/2022 06:25 PM   Modules accepted: Orders

## 2022-11-10 NOTE — Progress Notes (Signed)
See procedure note.

## 2022-11-14 ENCOUNTER — Telehealth: Payer: Self-pay | Admitting: *Deleted

## 2022-11-14 NOTE — Telephone Encounter (Signed)
-----   Message from [name], MD sent at 11/10/2022  6:25 PM EDT ----- Urgent set up requested on PAP therapy, due to severe OSA. Patient referred by PCP for OSA eval, seen by me on 10/12/2022, patient had a HST on 11/08/2022.    Please call and notify the patient that the recent home sleep test showed obstructive sleep apnea in the severe range. I recommend treatment for this in the form of autoPAP, which means, that we don't have to bring her in for a sleep study with CPAP, but will let her start using a so called autoPAP machine at home, through a DME company (of her choice, or as per insurance requirement). The DME representative will fit the patient with a mask of choice, educate her on how to use the machine, how to put the mask on, etc. I have placed an order in the chart. Please send the order to a local DME, talk to patient, send report to referring MD. Please also reinforce the need for compliance with treatment. We will need a FU in sleep clinic for 10 weeks post-PAP set up, please arrange that with me or one of our NPs. Thanks,   [name], MD, PhD [facility])

## 2022-11-14 NOTE — Telephone Encounter (Signed)
Spoke to patient gave sleep study results  Made patient aware she is a Urgent set due to severe OSA  Pt chose [employer] for DME  Educated patient on insurance compliance gave pt [employer] #  Scheduled f/u  appointment with Jessica,NP on  01/2023 . Pt expressed understanding and thanked me for calling . Faxed orders to [employer] this am and sent referring MD  sleep study results this am

## 2022-11-23 ENCOUNTER — Telehealth: Payer: Self-pay | Admitting: Neurology

## 2022-11-23 NOTE — Telephone Encounter (Signed)
I called the patient and let her know it appears everything is good to go now and [employer] will be calling her asap. I asked her to call them and/or [place] if she doesn't hear from them by tomorrow around lunchtime. She was appreciative.

## 2022-11-23 NOTE — Telephone Encounter (Signed)
New, [name], Otilio Jefferson, RN; [place], [place] We do take cone, I think I found the issues and I am working on it for you now. I will keep you posted. I have also informed Melissa on this so she may be calling.

## 2022-11-23 NOTE — Telephone Encounter (Signed)
"  New, Maryella Shivers, Otilio Jefferson, RN; Alain Honey; Kathe Becton; Gust Rung, [facility] [place],  Per note on account : 11/16/2022 patient's insurance is not eligible for pap machine, sent void fax to M.D. and attempted to call patient, LVM.  Looks like her insurance is saying they will not cover it. patient will need to call in for further details as it looks like we have attempted to contact her a couple of times but had to leave messages. We can provide her PP options if she would be interested.  let me know if you would like for me to have someone reach out to her again.  Thank you,  Brad New"  I messaged Brad back to see if he has further info as to why insurance is saying they won't approve. The patient has severe OSA.

## 2022-11-23 NOTE — Telephone Encounter (Signed)
New, Maryella Shivers, Otilio Jefferson, RN; [place], Megan Salon, [place] I got it re-reviewed and its good now.  We will call patient ASAP.  Thanks,  [employer]

## 2022-11-23 NOTE — Telephone Encounter (Signed)
I sent a message to [employer] to find out more information.

## 2022-11-23 NOTE — Telephone Encounter (Signed)
I was able to reach the patient. She said she called [employer] and was told there was an issue with not being in network. We had been told [employer]'s cone plan was only in network with [employer] so I told the patient I would let her know what I find out from [employer]. She was appreciative.

## 2022-11-23 NOTE — Telephone Encounter (Signed)
Pt stated insurance will not cover the CPAP machine and do not know where to go from here. Would like a call back.

## 2022-11-23 NOTE — Telephone Encounter (Addendum)
Nida Boatman said he will keep trying to get more information and he is getting Hospital doctor Emergency planning/management officer) with [employer] involved. I told him we appreciated that as this is an urgent setup from Dr Frances Furbish.

## 2022-11-24 ENCOUNTER — Encounter: Payer: Self-pay | Admitting: Family Medicine

## 2022-11-24 ENCOUNTER — Ambulatory Visit (INDEPENDENT_AMBULATORY_CARE_PROVIDER_SITE_OTHER): Payer: Commercial Managed Care - PPO | Admitting: Family Medicine

## 2022-11-24 ENCOUNTER — Other Ambulatory Visit (HOSPITAL_COMMUNITY): Payer: Self-pay

## 2022-11-24 VITALS — BP 126/82 | HR 66 | Temp 97.9°F | Ht 66.0 in | Wt 309.0 lb

## 2022-11-24 DIAGNOSIS — G4733 Obstructive sleep apnea (adult) (pediatric): Secondary | ICD-10-CM

## 2022-11-24 DIAGNOSIS — R7303 Prediabetes: Secondary | ICD-10-CM | POA: Diagnosis not present

## 2022-11-24 DIAGNOSIS — M25559 Pain in unspecified hip: Secondary | ICD-10-CM

## 2022-11-24 LAB — POCT GLYCOSYLATED HEMOGLOBIN (HGB A1C): Hemoglobin A1C: 6.2 % — AB (ref 4.0–5.6)

## 2022-11-24 MED ORDER — LIDOCAINE 5 % EX PTCH
1.0000 | MEDICATED_PATCH | CUTANEOUS | 0 refills | Status: AC
  Filled 2022-11-24: qty 30, 30d supply, fill #0

## 2022-11-24 NOTE — Assessment & Plan Note (Signed)
-   f/u A1c today.

## 2022-11-24 NOTE — Assessment & Plan Note (Signed)
Pt has upcoming healthy weight and wellness appt

## 2022-11-24 NOTE — Progress Notes (Signed)
   Established Patient Office Visit  Subjective   Patient ID: [name], female    DOB: 06/20/1980  Age: 42 y.o. MRN: [id number]  Chief Complaint  Patient presents with   Follow-up    HPI Pt states she did not get any relief from the steroid injection.  Would describe the pain now as worse.  Worst is when she is driving or sitting long periods of time.  But standing/walking for prolonged periods also hurts.  Pain now feels like it is going down farther down to the knee.  Describes it as sharp.  Minimal paresthesia.  Has tried heating pads/hot showers.  Has not used topicals.  Is almost out of meloxicam.    Pt has appt today to get her cpap.     The ASCVD Risk score (Arnett DK, et al., 2019) failed to calculate for the following reasons:   The valid total cholesterol range is 130 to 320 mg/dL     ROS    Objective:     BP 126/82   Pulse 66   Temp 97.9 F (36.6 C) (Oral)   Ht 5\' 6"  (1.676 m)   Wt (!) 309 lb (140.2 kg)   LMP 07/21/2015   SpO2 98%   BMI 49.87 kg/m    Physical Exam Gen: alert, oriented Msk: ttp over the left greater trochanter.  Normal sensation and DP pulse in the LLE   No results found for any visits on 11/24/22.        Assessment & Plan:   Prediabetes Assessment & Plan: - f/u A1c today.    Orders: -     POCT glycosylated hemoglobin (Hb A1C)  Greater trochanteric pain syndrome Assessment & Plan: Steroid injection from sports medicine clinic did not provide relief.  Meloxicam provides some relief but pain overall is not improving.  Now 8 weeks of pain.   - lidocaine patch provided.  Recommendd other topicals as well - heat - continue exercises.  Pt does not have availability in her schedule for dedicated PT appts  - advised pt to wait 2 weeks until after her last meloxicam dose before we prescribe a refill - will reach to dr. Pearletha Forge regarding further workup.  Has appt on 7/8.   - f/u 6 weeks.     OSA (obstructive sleep  apnea) Assessment & Plan: Pt picking up her cpap machine today.     Morbid obesity (HCC) Assessment & Plan: Pt has upcoming healthy weight and wellness appt    Other orders -     Lidocaine; Place 1 patch onto the skin daily. Remove & Discard patch within 12 hours or as directed by MD  Dispense: 30 patch; Refill: 0     Return in about 6 weeks (around 01/05/2023) for leg pain.    Sandre Kitty, MD

## 2022-11-24 NOTE — Patient Instructions (Signed)
It was nice to see you today,  We addressed the following topics today: - I will reach out to Dr. Pearletha Forge  - I have prescribed a lidocaine patch to wear once a day.  Take off before going to bed - you can also use other topicals like menthol containing products or voltaren gel/diclofenac gel - wait two weeks after you finish taking your meloxicam before we send in any refills  Have a great day,  Frederic Jericho, MD

## 2022-11-24 NOTE — Telephone Encounter (Signed)
I called [name] w/ [employer]. She is calling the manager over at the [place] office to discuss.

## 2022-11-24 NOTE — Telephone Encounter (Signed)
Pt is calling from DME, she was told on yesterday to bring on today $50.00 and plan to pay $8.00 a month, pt now at DME being told insurance is not approving, pt is asking for a call to discuss this.

## 2022-11-24 NOTE — Telephone Encounter (Signed)
I called Brad w/ [employer] and LVM asking for call back.

## 2022-11-24 NOTE — Telephone Encounter (Signed)
[name] w/ [employer] called back. The RT saw the previous note on pt's chart that said cpap wasn't approved so she called the patient right before her appt and told her. [name] spoke with them and all is well. Patient is on the way over for her appointment right now.

## 2022-11-24 NOTE — Assessment & Plan Note (Signed)
Steroid injection from sports medicine clinic did not provide relief.  Meloxicam provides some relief but pain overall is not improving.  Now 8 weeks of pain.   - lidocaine patch provided.  Recommendd other topicals as well - heat - continue exercises.  Pt does not have availability in her schedule for dedicated PT appts  - advised pt to wait 2 weeks until after her last meloxicam dose before we prescribe a refill - will reach to dr. Pearletha Forge regarding further workup.  Has appt on 7/8.   - f/u 6 weeks.

## 2022-11-24 NOTE — Assessment & Plan Note (Signed)
Pt picking up her cpap machine today.

## 2022-12-05 ENCOUNTER — Ambulatory Visit: Payer: Commercial Managed Care - PPO | Admitting: Family Medicine

## 2022-12-13 ENCOUNTER — Other Ambulatory Visit: Payer: Self-pay | Admitting: Oncology

## 2022-12-13 DIAGNOSIS — Z006 Encounter for examination for normal comparison and control in clinical research program: Secondary | ICD-10-CM

## 2022-12-14 ENCOUNTER — Encounter (INDEPENDENT_AMBULATORY_CARE_PROVIDER_SITE_OTHER): Payer: Commercial Managed Care - PPO | Admitting: Internal Medicine

## 2022-12-20 DIAGNOSIS — G4733 Obstructive sleep apnea (adult) (pediatric): Secondary | ICD-10-CM | POA: Diagnosis not present

## 2022-12-22 ENCOUNTER — Other Ambulatory Visit (HOSPITAL_COMMUNITY): Payer: Self-pay

## 2022-12-22 ENCOUNTER — Ambulatory Visit: Payer: Commercial Managed Care - PPO | Admitting: Family Medicine

## 2022-12-22 VITALS — BP 135/97 | Ht 66.0 in | Wt 312.0 lb

## 2022-12-22 DIAGNOSIS — M25552 Pain in left hip: Secondary | ICD-10-CM | POA: Diagnosis not present

## 2022-12-22 DIAGNOSIS — G8929 Other chronic pain: Secondary | ICD-10-CM | POA: Diagnosis not present

## 2022-12-22 MED ORDER — PREDNISONE 10 MG PO TABS
ORAL_TABLET | ORAL | 0 refills | Status: AC
  Filled 2022-12-22: qty 21, 6d supply, fill #0

## 2022-12-22 NOTE — Progress Notes (Signed)
PCP: Sandre Kitty, MD  Subjective:   HPI: Patient is a 42 y.o. female here for left hip pain.  Last seen 6/5 for greater trochanteric pain syndrome, received greater trochanter steroid injection and home exercises, continued on meloxicam.  Today, reports that her pain did not improve at all after the steroid shot at her last visit.  Meloxicam helped but she has not been taking this recently in order to save up for an upcoming trip.  She tried her home exercises for a couple of weeks without any relief so she stopped doing them.   Past Medical History:  Diagnosis Date   Allergy-induced asthma    COVID-19    2020   Endometriosis    Endometriosis    Headache    occasional migraines   Hypertension 01/2002   with pregnancy   Kidney stones    Pneumonia    2008   PONV (postoperative nausea and vomiting)    nausea after c-section   Prediabetes     Current Outpatient Medications on File Prior to Visit  Medication Sig Dispense Refill   albuterol (PROAIR HFA) 108 (90 Base) MCG/ACT inhaler Inhale 2 puffs into the lungs every 6 (six) hours as needed for wheezing or shortness of breath. (Patient taking differently: Inhale 2 puffs into the lungs as needed for wheezing or shortness of breath.) 8.5 g 2   albuterol (PROVENTIL) (2.5 MG/3ML) 0.083% nebulizer solution Take 3 mLs (2.5 mg total) by nebulization every 6 (six) hours as needed for wheezing or shortness of breath. (Patient taking differently: Take 2.5 mg by nebulization as needed for wheezing or shortness of breath.) 150 mL 1   budesonide-formoterol (SYMBICORT) 80-4.5 MCG/ACT inhaler Inhale 2 puffs into the lungs 2 (two) times daily. (Patient taking differently: Inhale 2 puffs into the lungs as needed.) 10.2 g 12   clindamycin-benzoyl peroxide (BENZACLIN) gel Apply topically 2 (two) times daily. 25 g 0   doxycycline (VIBRAMYCIN) 100 MG capsule Take 1 capsule (100 mg total) by mouth 2 (two) times daily. 20 capsule 0   escitalopram  (LEXAPRO) 10 MG tablet Take 1 tablet (10 mg total) by mouth daily. 30 tablet 1   estradiol (ESTRACE) 2 MG tablet Take 1 tablet (2 mg total) by mouth daily. 90 tablet 1   ibuprofen (ADVIL) 600 MG tablet Take 1 tablet (600 mg total) by mouth every 8 (eight) hours as needed. Take with food. (Patient taking differently: Take 600 mg by mouth as needed. Take with food.) 15 tablet 0   lidocaine (LIDODERM) 5 % Place 1 patch onto the skin daily. Remove & Discard patch within 12 hours or as directed by MD 30 patch 0   meloxicam (MOBIC) 15 MG tablet Take 1 tablet (15 mg total) by mouth daily. 30 tablet 0   montelukast (SINGULAIR) 10 MG tablet Take 1 tablet (10 mg total) by mouth at bedtime. 90 tablet 1   nystatin cream (MYCOSTATIN) Apply 1 application topically 2 (two) times daily. (Patient taking differently: Apply 1 application  topically as needed.) 30 g 2   triamcinolone cream (KENALOG) 0.1 % Apply 1 application topically 2 (two) times daily. (Patient taking differently: Apply 1 application  topically as needed.) 30 g 0   Vitamin D, Ergocalciferol, (DRISDOL) 1.25 MG (50000 UNIT) CAPS capsule Take 1 capsule (50,000 Units total) by mouth every 7 (seven) days. 12 capsule 0   No current facility-administered medications on file prior to visit.    Past Surgical History:  Procedure Laterality Date  ABDOMINAL HYSTERECTOMY N/A 08/03/2015   Procedure: HYSTERECTOMY ABDOMINAL;  Surgeon: Richarda Overlie, MD;  Location: [facility];  Service: Gynecology;  Laterality: N/A;   CESAREAN SECTION     x2   CHOLECYSTECTOMY     KIDNEY STONE SURGERY     LAPAROSCOPY  age 79   laser treatment for endometriosis  age 72   SALPINGOOPHORECTOMY Bilateral 08/03/2015   Procedure: BILATERAL SALPINGO OOPHORECTOMY;  Surgeon: Richarda Overlie, MD;  Location: [facility];  Service: Gynecology;  Laterality: Bilateral;    Allergies  Allergen Reactions   Latex Rash   Sulfa Antibiotics Rash    BP (!) 148/67   Ht 5\' 6"  (1.676 m)   Wt (!) 312  lb (141.5 kg)   LMP 07/21/2015   BMI 50.36 kg/m       No data to display              No data to display              Objective:  Physical Exam:  Gen: NAD, comfortable in exam room  L hip: Inspection: No gross deformity, ecchymoses, swelling Palpation: Tender to palpation over greater trochanter and through gluteal muscles, nontender along the iliac crest ROM: Full range of motion Strength: 5-/5 strength with hip abduction and hip flexion.  Otherwise 5/5 strength Special tests: Pain with FABER/FADIR   Assessment & Plan:  1.  Left hip pain: Symptoms consistent with greater trochanteric pain syndrome although her pain did not improve at all with steroid injection.  Reviewed left femur x-ray which did not show significant joint space narrowing/arthritis in her hip.  However, will obtain MRI for further evaluation and to rule out possible gluteal muscle/tendon injury.  Will also prescribe prednisone Dosepak x 6 days.Meloxicam has been helping somewhat so she can continue this (after finishing prednisone) as well as other conservative measures.

## 2022-12-22 NOTE — Patient Instructions (Addendum)
We will go ahead with an MRI of your hip. I'll call you with the results and next steps. Continue with your home exercises. Take prednisone dose pack x 6 days as directed. Don't take aleve, ibuprofen, or meloxicam while on this but can restart one of these the day after finishing the prednisone.

## 2022-12-24 DIAGNOSIS — G4733 Obstructive sleep apnea (adult) (pediatric): Secondary | ICD-10-CM | POA: Diagnosis not present

## 2022-12-26 ENCOUNTER — Encounter: Payer: Self-pay | Admitting: Family Medicine

## 2023-01-04 ENCOUNTER — Ambulatory Visit: Payer: Commercial Managed Care - PPO | Admitting: Family Medicine

## 2023-01-06 ENCOUNTER — Encounter: Payer: Self-pay | Admitting: Family Medicine

## 2023-01-09 ENCOUNTER — Ambulatory Visit
Admission: RE | Admit: 2023-01-09 | Discharge: 2023-01-09 | Disposition: A | Discharge status: Home / Self Care | Payer: Commercial Managed Care - PPO | Source: Ambulatory Visit | Attending: Family Medicine | Admitting: Family Medicine

## 2023-01-09 DIAGNOSIS — G8929 Other chronic pain: Secondary | ICD-10-CM

## 2023-01-09 DIAGNOSIS — M67854 Other specified disorders of tendon, left hip: Secondary | ICD-10-CM | POA: Diagnosis not present

## 2023-01-09 DIAGNOSIS — M25552 Pain in left hip: Secondary | ICD-10-CM | POA: Diagnosis not present

## 2023-01-09 DIAGNOSIS — M67853 Other specified disorders of tendon, right hip: Secondary | ICD-10-CM | POA: Diagnosis not present

## 2023-01-20 DIAGNOSIS — G4733 Obstructive sleep apnea (adult) (pediatric): Secondary | ICD-10-CM | POA: Diagnosis not present

## 2023-01-24 DIAGNOSIS — G4733 Obstructive sleep apnea (adult) (pediatric): Secondary | ICD-10-CM | POA: Diagnosis not present

## 2023-01-27 ENCOUNTER — Other Ambulatory Visit (HOSPITAL_COMMUNITY): Payer: Self-pay

## 2023-01-27 MED ORDER — PREDNISONE 10 MG PO TABS
ORAL_TABLET | ORAL | 0 refills | Status: AC
  Filled 2023-01-27 – 2023-02-10 (×2): qty 21, 6d supply, fill #0

## 2023-01-27 MED ORDER — MELOXICAM 15 MG PO TABS
15.0000 mg | ORAL_TABLET | Freq: Every day | ORAL | 1 refills | Status: AC
  Filled 2023-01-27 – 2023-02-10 (×2): qty 30, 30d supply, fill #0
  Filled 2023-11-28: qty 30, 30d supply, fill #1

## 2023-01-27 NOTE — Addendum Note (Signed)
Addended by: Lenda Kelp on: 01/27/2023 04:47 PM   Modules accepted: Orders

## 2023-01-27 NOTE — Addendum Note (Signed)
Addended by: Lenda Kelp on: 01/27/2023 04:40 PM   Modules accepted: Orders

## 2023-02-08 ENCOUNTER — Other Ambulatory Visit (HOSPITAL_COMMUNITY): Payer: Self-pay

## 2023-02-09 NOTE — Progress Notes (Signed)
[facility] [address] [place]. [place] [address] [phone]       OFFICE FOLLOW UP NOTE  Ms. [name] Date of Birth:  1981/03/03 Medical Record Number:  [id number]   Primary neurologist: Dr. Frances Furbish Reason for visit: Initial CPAP follow-up    SUBJECTIVE:   CHIEF COMPLAINT:  Chief Complaint  Patient presents with   Follow-up    Rm 3, here alone Pt is here for initial CPAP. Pt states she is doing well, pt states there are some leaks, questions If mask needs to be changed.     Follow-up visit:  Prior visit: 10/12/2022 Dr. Frances Furbish  Brief HPI:   [name] is a 42 y.o. female who was initially evaluated by Dr. Frances Furbish on 10/12/2022 with prior diagnosis of mild sleep apnea in 2021 not on CPAP with complaints of excessive daytime somnolence and morning headaches. ESS 22/24, FSS 61/63.  HST 11/01/2022 showed severe OSA with total AHI of 36.1/h and O2 nadir of 77% with significant time below or at 88% saturation of over 45 minutes indicating nocturnal hypoxemia.  AutoPap initiated 11/24/2022.     Interval history:  Report shows satisfactory usage and optimal residual AHI. Reports tolerating CPAP well overall, does have occasional leaks. Current use of FFM. Initially felt great improvement of daytime fatigue but gradually started to worsen again, still not as bad as prior to using CPAP but still present. Also continues to have occasional morning headaches.  No longer falling asleep at work or during conversations but feels like she continues to struggle to just get around. Typically sleeps 4-6 hours. She is currently working third shift, will be transitioning to second shift next week and is hopeful this will help and will be able to sleep longer duration. ESS 18/24 (prior to CPAP 22/24). FSS 61/63 (prior to CPAP 61/63).             ROS:   14 system review of systems performed and negative with exception of those listed in HPI  PMH:  Past  Medical History:  Diagnosis Date   Allergy-induced asthma    COVID-19    2020   Endometriosis    Endometriosis    Headache    occasional migraines   Hypertension 01/2002   with pregnancy   Kidney stones    Pneumonia    2008   PONV (postoperative nausea and vomiting)    nausea after c-section   Prediabetes     PSH:  Past Surgical History:  Procedure Laterality Date   ABDOMINAL HYSTERECTOMY N/A 08/03/2015   Procedure: HYSTERECTOMY ABDOMINAL;  Surgeon: Richarda Overlie, MD;  Location: [facility];  Service: Gynecology;  Laterality: N/A;   CESAREAN SECTION     x2   CHOLECYSTECTOMY     KIDNEY STONE SURGERY     LAPAROSCOPY  age 42   laser treatment for endometriosis  age 89   SALPINGOOPHORECTOMY Bilateral 08/03/2015   Procedure: BILATERAL SALPINGO OOPHORECTOMY;  Surgeon: Richarda Overlie, MD;  Location: [facility];  Service: Gynecology;  Laterality: Bilateral;    Social History:  Social History   Socioeconomic History   Marital status: Single    Spouse name: Not on file   Number of children: 2   Years of education: Not on file   Highest education level: Not on file  Occupational History    Employer: [employer]  Tobacco Use   Smoking status: Never   Smokeless tobacco: Never  Vaping Use   Vaping status:  Never Used  Substance and Sexual Activity   Alcohol use: Yes    Comment: occasional   Drug use: No   Sexual activity: Not Currently    Birth control/protection: Surgical  Other Topics Concern   Not on file  Social History Narrative   Lives at home with her son and daughter   Social Determinants of Health   Financial Resource Strain: Not on file  Food Insecurity: Not on file  Transportation Needs: Not on file  Physical Activity: Not on file  Stress: Not on file  Social Connections: Not on file  Intimate Partner Violence: Not on file    Family History:  Family History  Problem Relation Age of Onset   Hypertension Father    Heart attack Father    Sleep apnea  Father    Alcohol abuse Maternal Uncle    Sleep apnea Paternal Aunt    Sleep apnea Paternal Uncle    Breast cancer Maternal Grandmother    Cancer Paternal Grandfather        mesothelioma   Sleep apnea Cousin     Medications:   Current Outpatient Medications on File Prior to Visit  Medication Sig Dispense Refill   clindamycin-benzoyl peroxide (BENZACLIN) gel Apply topically 2 (two) times daily. 25 g 0   ibuprofen (ADVIL) 600 MG tablet Take 1 tablet (600 mg total) by mouth every 8 (eight) hours as needed. Take with food. (Patient taking differently: Take 600 mg by mouth as needed. Take with food.) 15 tablet 0   lidocaine (LIDODERM) 5 % Place 1 patch onto the skin daily. Remove & Discard patch within 12 hours or as directed by MD 30 patch 0   meloxicam (MOBIC) 15 MG tablet Take 1 tablet (15 mg total) by mouth daily. 30 tablet 1   predniSONE (DELTASONE) 10 MG tablet Take 6 tablets (60 mg total) by mouth daily for 1 day, THEN 5 tablets (50 mg total) daily for 1 day, THEN 4 tablets (40 mg total) daily for 1 day, THEN 3 tablets (30 mg total) daily for 1 day, THEN 2 tablets (20 mg total) daily for 1 day, THEN 1 tablet (10 mg total) daily for 1 day. 21 tablet 0   albuterol (PROAIR HFA) 108 (90 Base) MCG/ACT inhaler Inhale 2 puffs into the lungs every 6 (six) hours as needed for wheezing or shortness of breath. (Patient not taking: Reported on 02/13/2023) 8.5 g 2   albuterol (PROVENTIL) (2.5 MG/3ML) 0.083% nebulizer solution Take 3 mLs (2.5 mg total) by nebulization every 6 (six) hours as needed for wheezing or shortness of breath. (Patient not taking: Reported on 02/13/2023) 150 mL 1   budesonide-formoterol (SYMBICORT) 80-4.5 MCG/ACT inhaler Inhale 2 puffs into the lungs 2 (two) times daily. (Patient not taking: Reported on 02/13/2023) 10.2 g 12   escitalopram (LEXAPRO) 10 MG tablet Take 1 tablet (10 mg total) by mouth daily. 30 tablet 1   estradiol (ESTRACE) 2 MG tablet Take 1 tablet (2 mg total) by mouth  daily. 90 tablet 1   montelukast (SINGULAIR) 10 MG tablet Take 1 tablet (10 mg total) by mouth at bedtime. 90 tablet 1   nystatin cream (MYCOSTATIN) Apply 1 application topically 2 (two) times daily. (Patient not taking: Reported on 02/13/2023) 30 g 2   triamcinolone cream (KENALOG) 0.1 % Apply 1 application topically 2 (two) times daily. (Patient not taking: Reported on 02/13/2023) 30 g 0   No current facility-administered medications on file prior to visit.    Allergies:  Allergies  Allergen Reactions   Latex Rash   Sulfa Antibiotics Rash      OBJECTIVE:  Physical Exam  Vitals:   02/13/23 1239  BP: (!) 145/83  Pulse: 75  Weight: (!) 322 lb (146.1 kg)  Height: 5\' 6"  (1.676 m)   Body mass index is 51.97 kg/m. No results found.   General: well developed, well nourished, very pleasant middle-age Caucasian female, seated, in no evident distress Head: head normocephalic and atraumatic.   Neck: supple with no carotid or supraclavicular bruits Cardiovascular: regular rate and rhythm, no murmurs Musculoskeletal: no deformity Skin:  no rash/petichiae Vascular:  Normal pulses all extremities   Neurologic Exam Mental Status: Awake and fully alert. Oriented to place and time. Recent and remote memory intact. Attention span, concentration and fund of knowledge appropriate. Mood and affect appropriate.  Cranial Nerves: Pupils equal, briskly reactive to light. Extraocular movements full without nystagmus. Visual fields full to confrontation. Hearing intact. Facial sensation intact. Face, tongue, palate moves normally and symmetrically.  Motor: Normal bulk and tone. Normal strength in all tested extremity muscles Sensory.: intact to touch , pinprick , position and vibratory sensation.  Coordination: Rapid alternating movements normal in all extremities. Finger-to-nose and heel-to-shin performed accurately bilaterally. Gait and Station: Arises from chair without difficulty. Stance is  normal. Gait demonstrates normal stride length and balance without use of AD. Tandem walk and heel toe without difficulty.  Reflexes: 1+ and symmetric. Toes downgoing.         ASSESSMENT/PLAN: RAHEL MOTTOLA is a 42 y.o. year old female    OSA on CPAP : Nocturnal hypoxemia: Excessive daytime sleepiness: CPAP compliance report shows satisfactory usage with optimal residual AHI.  Continue current pressure settings. Continues high ESS and FSS, will check overnight oximetry to assess for continued nocturnal hypoxemia despite CPAP use possibly contributing. Did discuss getting only 4-6 hrs of sleep per night also likely contributing.  Discussed continued nightly usage with ensuring greater than 4 hours nightly for optimal benefit and per insurance purposes.  Continue to follow with DME company for any needed supplies or CPAP related concerns     Follow up in 6 months or call earlier if needed   CC:  PCP: Sandre Kitty, MD    I spent 30 minutes of face-to-face and non-face-to-face time with patient.  This included previsit chart review, lab review, study review, order entry, electronic health record documentation, patient education and discussion regarding above diagnoses and treatment plan and answered all other questions to patient's satisfaction    Ihor Austin, [facility]  [facility] [address] [place], [place] [address]  Phone [phone] Fax [phone] Note: This document was prepared with digital dictation and possible smart phrase technology. Any transcriptional errors that result from this process are unintentional.

## 2023-02-10 ENCOUNTER — Other Ambulatory Visit (HOSPITAL_COMMUNITY): Payer: Self-pay

## 2023-02-13 ENCOUNTER — Encounter: Payer: Self-pay | Admitting: Adult Health

## 2023-02-13 ENCOUNTER — Ambulatory Visit (INDEPENDENT_AMBULATORY_CARE_PROVIDER_SITE_OTHER): Payer: Commercial Managed Care - PPO | Admitting: Adult Health

## 2023-02-13 VITALS — BP 145/83 | HR 75 | Ht 66.0 in | Wt 322.0 lb

## 2023-02-13 DIAGNOSIS — G4734 Idiopathic sleep related nonobstructive alveolar hypoventilation: Secondary | ICD-10-CM | POA: Diagnosis not present

## 2023-02-13 DIAGNOSIS — G4733 Obstructive sleep apnea (adult) (pediatric): Secondary | ICD-10-CM

## 2023-02-13 DIAGNOSIS — G4719 Other hypersomnia: Secondary | ICD-10-CM

## 2023-02-13 NOTE — Patient Instructions (Addendum)
Your Plan:  Continue nightly use of CPAP for sleep apnea management  You will be called to complete an overnight oximetry test to ensure improvement of oxygen levels with use of CPAP - we will call you once we have the results   Continue to follow with DME company for any needed supplies or CPAP related concerns     Follow up in 6 months or call earlier if needed     Thank you for coming to see [place] at [facility]. I hope we have been able to provide you high quality care today.  You may receive a patient satisfaction survey over the next few weeks. We would appreciate your feedback and comments so that we may continue to improve ourselves and the health of our patients.

## 2023-02-15 ENCOUNTER — Telehealth: Payer: Self-pay

## 2023-02-20 DIAGNOSIS — G4733 Obstructive sleep apnea (adult) (pediatric): Secondary | ICD-10-CM | POA: Diagnosis not present

## 2023-02-23 DIAGNOSIS — G4733 Obstructive sleep apnea (adult) (pediatric): Secondary | ICD-10-CM | POA: Diagnosis not present

## 2023-02-24 DIAGNOSIS — G4733 Obstructive sleep apnea (adult) (pediatric): Secondary | ICD-10-CM | POA: Diagnosis not present

## 2023-03-13 ENCOUNTER — Encounter: Payer: Self-pay | Admitting: Adult Health

## 2023-03-25 DIAGNOSIS — G4733 Obstructive sleep apnea (adult) (pediatric): Secondary | ICD-10-CM | POA: Diagnosis not present

## 2023-03-26 DIAGNOSIS — G4733 Obstructive sleep apnea (adult) (pediatric): Secondary | ICD-10-CM | POA: Diagnosis not present

## 2023-03-27 ENCOUNTER — Other Ambulatory Visit (HOSPITAL_COMMUNITY): Payer: Commercial Managed Care - PPO

## 2023-04-05 ENCOUNTER — Ambulatory Visit (HOSPITAL_COMMUNITY)
Admission: EM | Admit: 2023-04-05 | Discharge: 2023-04-05 | Disposition: A | Discharge status: Home / Self Care | Payer: Commercial Managed Care - PPO | Attending: Family Medicine | Admitting: Family Medicine

## 2023-04-05 ENCOUNTER — Other Ambulatory Visit: Payer: Self-pay

## 2023-04-05 ENCOUNTER — Ambulatory Visit (HOSPITAL_BASED_OUTPATIENT_CLINIC_OR_DEPARTMENT_OTHER): Payer: Self-pay

## 2023-04-05 ENCOUNTER — Encounter (HOSPITAL_COMMUNITY): Payer: Self-pay | Admitting: Emergency Medicine

## 2023-04-05 DIAGNOSIS — M79605 Pain in left leg: Secondary | ICD-10-CM | POA: Diagnosis not present

## 2023-04-05 MED ORDER — DEXAMETHASONE SODIUM PHOSPHATE 10 MG/ML IJ SOLN
10.0000 mg | Freq: Once | INTRAMUSCULAR | Status: AC
  Administered 2023-04-05: 10 mg via INTRAMUSCULAR

## 2023-04-05 MED ORDER — DEXAMETHASONE SODIUM PHOSPHATE 10 MG/ML IJ SOLN
INTRAMUSCULAR | Status: AC
  Filled 2023-04-05: qty 1

## 2023-04-05 MED ORDER — KETOROLAC TROMETHAMINE 60 MG/2ML IM SOLN
INTRAMUSCULAR | Status: AC
  Filled 2023-04-05: qty 2

## 2023-04-05 MED ORDER — KETOROLAC TROMETHAMINE 60 MG/2ML IM SOLN
60.0000 mg | Freq: Once | INTRAMUSCULAR | Status: AC
  Administered 2023-04-05: 60 mg via INTRAMUSCULAR

## 2023-04-05 NOTE — ED Notes (Signed)
Reviewed work note 
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
## 2023-04-21 ENCOUNTER — Other Ambulatory Visit (HOSPITAL_COMMUNITY)
Admission: RE | Admit: 2023-04-21 | Discharge: 2023-04-21 | Disposition: A | Discharge status: Home / Self Care | Payer: Commercial Managed Care - PPO | Source: Ambulatory Visit | Attending: Oncology | Admitting: Oncology

## 2023-04-21 DIAGNOSIS — Z006 Encounter for examination for normal comparison and control in clinical research program: Secondary | ICD-10-CM | POA: Insufficient documentation

## 2023-04-25 DIAGNOSIS — G4733 Obstructive sleep apnea (adult) (pediatric): Secondary | ICD-10-CM | POA: Diagnosis not present

## 2023-04-26 DIAGNOSIS — G4733 Obstructive sleep apnea (adult) (pediatric): Secondary | ICD-10-CM | POA: Diagnosis not present

## 2023-04-29 ENCOUNTER — Ambulatory Visit (HOSPITAL_BASED_OUTPATIENT_CLINIC_OR_DEPARTMENT_OTHER)
Admission: EM | Admit: 2023-04-29 | Discharge: 2023-04-29 | Disposition: A | Discharge status: Home / Self Care | Payer: Commercial Managed Care - PPO | Attending: Internal Medicine | Admitting: Internal Medicine

## 2023-04-29 ENCOUNTER — Other Ambulatory Visit: Payer: Self-pay

## 2023-04-29 ENCOUNTER — Encounter (HOSPITAL_BASED_OUTPATIENT_CLINIC_OR_DEPARTMENT_OTHER): Payer: Self-pay | Admitting: *Deleted

## 2023-04-29 DIAGNOSIS — J189 Pneumonia, unspecified organism: Secondary | ICD-10-CM | POA: Diagnosis not present

## 2023-04-29 MED ORDER — ALBUTEROL SULFATE HFA 108 (90 BASE) MCG/ACT IN AERS
1.0000 | INHALATION_SPRAY | Freq: Four times a day (QID) | RESPIRATORY_TRACT | 0 refills | Status: DC | PRN
Start: 2023-04-29 — End: 2023-08-28

## 2023-04-29 MED ORDER — AMOXICILLIN 875 MG PO TABS: 875.0000 mg | ORAL_TABLET | Freq: Two times a day (BID) | ORAL | 0 refills | Status: AC

## 2023-04-29 MED ORDER — AZITHROMYCIN 250 MG PO TABS
250.0000 mg | ORAL_TABLET | Freq: Every day | ORAL | 0 refills | Status: DC
Start: 2023-04-29 — End: 2024-04-11

## 2023-04-29 NOTE — ED Triage Notes (Signed)
C/O occasionally productive cough with green sputum onset 5 days ago, along with hoarse voice. Today woke up feeling SOB. Denies fevers. Has been taking Aleve and IBU.

## 2023-04-29 NOTE — ED Provider Notes (Signed)
[facility]    CSN: [id number] Arrival date & time: 04/29/23  1107      History   Chief Complaint Chief Complaint  Patient presents with   Shortness of Breath   Cough    HPI [name] is a 42 y.o. female.    Shortness of Breath Associated symptoms: cough   Cough Associated symptoms: shortness of breath   Sick for 5 days with generalized fatigue, scratchy throat hoarse voice then developed rhinorrhea, nasal congestion and cough with green sputum.  Admits wheezing, chest tightness and shortness of breath.  Admits recent contact with family member who has pneumonia.  Has had wheezing in the past, could not find her inhaler.  Has had pneumonia in the past.  Admits chest tightness with cough admits hoarse voice denies documented fever or chills Denies smoking Past Medical History:  Diagnosis Date   Allergy-induced asthma    COVID-19    2020   Endometriosis    Endometriosis    Headache    occasional migraines   Hypertension 01/2002   with pregnancy   Kidney stones    Pneumonia    2008   PONV (postoperative nausea and vomiting)    nausea after c-section   Prediabetes     Patient Active Problem List   Diagnosis Date Noted   Left hip pain 10/27/2022   Anxiety and depression 09/22/2022   Acne vulgaris 08/25/2022   Morbid obesity (HCC) 08/25/2022   OSA (obstructive sleep apnea) 08/25/2022   Changes in vision 08/25/2022   Encounter to establish care 08/25/2022   Prediabetes 06/30/2021   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      Home Medications    Prior to Admission medications   Medication Sig Start Date End Date Taking? Authorizing Provider  ibuprofen (ADVIL) 600 MG tablet Take 1 tablet (600 mg total) by mouth every 8 (eight) hours as needed. Take with food. Patient taking differently: Take 600 mg by mouth as needed. Take with food. 11/02/20  Yes Cathren Laine, MD  albuterol (PROAIR HFA) 108 (90 Base) MCG/ACT inhaler Inhale 2 puffs into the lungs every 6 (six) hours as needed for wheezing or shortness of breath. Patient not taking: Reported on 02/13/2023 06/16/21   Allwardt, Alyssa M, PA-C  albuterol (PROVENTIL) (2.5 MG/3ML) 0.083% nebulizer solution Take 3 mLs (2.5 mg total) by nebulization every 6 (six) hours as needed for wheezing or shortness of breath. Patient not taking: Reported on 02/13/2023 06/16/21   Allwardt, Crist Infante, PA-C  budesonide-formoterol (SYMBICORT) 80-4.5 MCG/ACT inhaler Inhale 2 puffs into the lungs 2 (two) times daily. Patient not taking: Reported on 02/13/2023 06/16/21   Allwardt, Crist Infante, PA-C  clindamycin-benzoyl peroxide (BENZACLIN) gel Apply topically 2 (two) times daily. 08/25/22   Sandre Kitty, MD  escitalopram (LEXAPRO) 10 MG tablet Take 1 tablet (10 mg total)  by mouth daily. Patient not taking: Reported on 04/05/2023 09/21/22 11/20/22  Sandre Kitty, MD  estradiol (ESTRACE) 2 MG tablet Take 1 tablet (2 mg total) by mouth daily. 06/16/21 09/15/21  Allwardt, Crist Infante, PA-C  lidocaine (LIDODERM) 5 % Place 1 patch onto the skin daily. Remove & Discard patch within 12 hours or as directed by MD 11/24/22   Sandre Kitty, MD  meloxicam (MOBIC) 15 MG tablet Take 1 tablet (15 mg total) by mouth daily. 01/27/23   Hudnall, Azucena Fallen, MD  montelukast (SINGULAIR) 10 MG tablet Take 1 tablet (10 mg total) by mouth at bedtime.  06/16/21 09/14/21  Allwardt, Crist Infante, PA-C  nystatin cream (MYCOSTATIN) Apply 1 application topically 2 (two) times daily. Patient not taking: Reported on 02/13/2023 06/16/21   Allwardt, Crist Infante, PA-C  triamcinolone cream (KENALOG) 0.1 % Apply 1 application topically 2 (two) times daily. Patient not taking: Reported on 02/13/2023 07/05/21   Kristian Covey, MD    Family History Family History  Problem Relation Age of Onset   Hypertension Father    Heart attack Father    Sleep apnea Father    Breast cancer Maternal Grandmother    Cancer Paternal Grandfather        mesothelioma   Alcohol abuse Maternal Uncle    Sleep apnea Paternal Aunt    Sleep apnea Paternal Uncle    Sleep apnea Cousin     Social History Social History   Tobacco Use   Smoking status: Never   Smokeless tobacco: Never  Vaping Use   Vaping status: Never Used  Substance Use Topics   Alcohol use: Yes    Comment: occasional   Drug use: No     Allergies   Latex and Sulfa antibiotics   Review of Systems Review of Systems  Respiratory:  Positive for cough and shortness of breath.      Physical Exam Triage Vital Signs ED Triage Vitals  Encounter Vitals Group     BP 04/29/23 1125 119/83     Systolic BP Percentile --      Diastolic BP Percentile --      Pulse Rate 04/29/23 1125 73     Resp 04/29/23 1125 18     Temp 04/29/23 1125 98 F (36.7 C)     Temp Source 04/29/23 1125 Oral     SpO2 04/29/23 1125 98 %     Weight --      Height --      Head Circumference --      Peak Flow --      Pain Score 04/29/23 1128 2     Pain Loc --      Pain Education --      Exclude from Growth Chart --    No data found.  Updated Vital Signs BP 119/83   Pulse 73   Temp 98 F (36.7 C) (Oral)   Resp 18   LMP 07/21/2015   SpO2 98%   Visual Acuity Right Eye Distance:   Left Eye Distance:   Bilateral Distance:    Right Eye Near:   Left Eye Near:    Bilateral Near:     Physical Exam Vitals and nursing  note reviewed.  Constitutional:      Appearance: She is obese. She is not ill-appearing.  HENT:     Head: Normocephalic.  Cardiovascular:     Rate and Rhythm: Normal rate and regular rhythm.  Pulmonary:     Effort: Pulmonary  effort is normal.     Breath sounds: Decreased breath sounds and rhonchi present. No wheezing.  Musculoskeletal:     Cervical back: Neck supple.  Lymphadenopathy:     Cervical: No cervical adenopathy.  Skin:    General: Skin is warm and dry.  Neurological:     Mental Status: She is alert.      UC Treatments / Results  Labs (all labs ordered are listed, but only abnormal results are displayed) Labs Reviewed - No data to display  EKG   Radiology No results found.  Procedures Procedures (including critical care time)  Medications Ordered in UC Medications - No data to display  Initial Impression / Assessment and Plan / UC Course  I have reviewed the triage vital signs and the nursing notes.  Pertinent labs & imaging results that were available during my care of the patient were reviewed by me and considered in my medical decision making (see chart for details).     42 year old female with cough for several days now increased sputum, shortness of breath and chest discomfort with coughing.  Concern for pneumonia will treat with antibiotics and inhaler, ED for worsening symptoms, follow-up PCP next week Final Clinical Impressions(s) / UC Diagnoses   Final diagnoses:  None   Discharge Instructions   None    ED Prescriptions   None    PDMP not reviewed this encounter.   Meliton Rattan, [place] 04/29/23 [phone]

## 2023-04-29 NOTE — Discharge Instructions (Addendum)
See your doctor this week for recheck Go to the emergency department for worsening symptoms new symptoms or concerns

## 2023-05-02 ENCOUNTER — Encounter: Payer: Self-pay | Admitting: Family Medicine

## 2023-05-02 ENCOUNTER — Ambulatory Visit (INDEPENDENT_AMBULATORY_CARE_PROVIDER_SITE_OTHER): Payer: Commercial Managed Care - PPO | Admitting: Family Medicine

## 2023-05-02 VITALS — BP 130/74 | HR 75 | Ht 66.0 in | Wt 315.4 lb

## 2023-05-02 DIAGNOSIS — J988 Other specified respiratory disorders: Secondary | ICD-10-CM

## 2023-05-02 DIAGNOSIS — R7303 Prediabetes: Secondary | ICD-10-CM

## 2023-05-02 LAB — GENECONNECT MOLECULAR SCREEN: Genetic Analysis Overall Interpretation: NEGATIVE

## 2023-05-02 LAB — POCT GLYCOSYLATED HEMOGLOBIN (HGB A1C): HbA1c POC (<> result, manual entry): 6.2 % (ref 4.0–5.6)

## 2023-05-02 MED ORDER — BENZONATATE 200 MG PO CAPS: 200.0000 mg | ORAL_CAPSULE | Freq: Two times a day (BID) | ORAL | 0 refills | Status: DC | PRN

## 2023-05-02 NOTE — Progress Notes (Unsigned)
   Acute Office Visit  Subjective:     Patient ID: MIAJA NORCOTT, female    DOB: [birth year], 42 y.o.   MRN: [id number]  Chief Complaint  Patient presents with   Follow-up    HPI Patient is in today for Monday - then Saturday.  Amox and azithro.  7 days.  No fevers.  Short of breath.  Tightness in chest. Home covid test.  Negative.  Saturday morning. Niece was sick about the same time. Nasal spray helps with congestoin.  Cough pills.  Tessalon perls.   Eyes blurry some good.  Some headaches.  No dv.    ROS      Objective:    BP 130/74   Pulse 75   Ht 5\' 6"  (1.676 m)   Wt (!) 315 lb 6.4 oz (143.1 kg)   LMP 07/21/2015   SpO2 96%   BMI 50.91 kg/m  {Vitals History (Optional):23777}  Physical Exam  No results found for any visits on 05/02/23.      Assessment & Plan:   There are no diagnoses linked to this encounter.   No follow-ups on file.  Sandre Kitty, MD

## 2023-05-02 NOTE — Patient Instructions (Signed)
It was nice to see you today,  We addressed the following topics today: -Continue taking your antibiotics until the course is finished - For symptomatic relief you can do the following - For nasal congestion use your Afrin and also use over-the-counter nasal saline spray.  You can use the nasal saline spray as much as you would like. - For sore throat you can use over-the-counter throat lozenges or throat sprays like Chloraseptic or Cepacol.  You can also gargle with saline. - I will send in a prescription for Tessalon Perles.  You can also use any product with guaifenesin or dextromethorphan in them that are available over-the-counter such as Mucinex DM.  Have a great day,  Frederic Jericho, MD

## 2023-05-03 DIAGNOSIS — J988 Other specified respiratory disorders: Secondary | ICD-10-CM | POA: Insufficient documentation

## 2023-05-03 NOTE — Assessment & Plan Note (Signed)
Over a week now since onset of symptoms.  Patient COVID home test was negative.  Was treated for bacterial pneumonia.  He is currently taking her antibiotics.  Recommended symptomatic treatment with nasal decongestant, nasal saline, lozenges for sore throat, and Tessalon Perles and Mucinex for cough.

## 2023-05-26 DIAGNOSIS — G4733 Obstructive sleep apnea (adult) (pediatric): Secondary | ICD-10-CM | POA: Diagnosis not present

## 2023-06-01 ENCOUNTER — Ambulatory Visit: Payer: Commercial Managed Care - PPO | Admitting: Family Medicine

## 2023-06-01 NOTE — Progress Notes (Deleted)
   Established Patient Office Visit  Subjective   Patient ID: ELLSIE VIOLETTE, female    DOB: 1981-03-27  Age: 43 y.o. MRN: [id number]  No chief complaint on file.   HPI  Pneumonia symptoms  Blurred vision - still an issue? Ophto referral?    The ASCVD Risk score (Arnett DK, et al., 2019) failed to calculate for the following reasons:   The valid total cholesterol range is 130 to 320 mg/dL  Health Maintenance Due  Topic Date Due   Hepatitis C Screening  Never done   DTaP/Tdap/Td (1 - Tdap) Never done   Cervical Cancer Screening (HPV/Pap Cotest)  09/03/2017   INFLUENZA VACCINE  12/29/2022   COVID-19 Vaccine (3 - 2024-25 season) 01/29/2023      Objective:     LMP 07/21/2015  {Vitals History (Optional):23777}  Physical Exam   No results found for any visits on 06/01/23.      Assessment & Plan:   There are no diagnoses linked to this encounter.   No follow-ups on file.    Toribio MARLA Slain, MD

## 2023-06-21 DIAGNOSIS — H524 Presbyopia: Secondary | ICD-10-CM | POA: Diagnosis not present

## 2023-06-26 DIAGNOSIS — G4733 Obstructive sleep apnea (adult) (pediatric): Secondary | ICD-10-CM | POA: Diagnosis not present

## 2023-07-11 ENCOUNTER — Other Ambulatory Visit: Payer: Self-pay | Admitting: Family Medicine

## 2023-07-11 DIAGNOSIS — N6489 Other specified disorders of breast: Secondary | ICD-10-CM

## 2023-07-27 DIAGNOSIS — G4733 Obstructive sleep apnea (adult) (pediatric): Secondary | ICD-10-CM | POA: Diagnosis not present

## 2023-08-07 ENCOUNTER — Ambulatory Visit: Payer: Self-pay | Admitting: Family Medicine

## 2023-08-07 NOTE — Telephone Encounter (Signed)
 Copied from CRM [id number]. Topic: Clinical - Medical Advice >> Aug 07, 2023  3:50 PM Teressa P wrote: Reason for CRM: pt called wanting to schedule an appt at [facility] for back pain and abd pain, she also stated that her eyes have been draining.   There are no appointments until April.  Please advise.  [phone]  Chief Complaint: back pain Symptoms: L sided back pain and L abd pain, cramps come and goes, 9/10 when present  Frequency: ongoing a while but has gotten worse Pertinent Negatives: Patient denies nausea or BM issues Disposition: [] ED /[] Urgent Care (no appt availability in office) / [x] Appointment(In office/virtual)/ []  [facility]/ [] Home Care/ [] Refused Recommended Disposition /[] [facility]/ []  Follow-up with PCP Additional Notes: pt states she has had this problem ongoing but never been seen for it. Unsure what causing the pain. Lasts approx 5-15 mins when present. Not taking anything for the pain when present. No appts with PCP practice. Offered appt with [facility] tomorrow, pt accepted. Scheduled for 840 with Lajuana Matte, NP. Pt also asking about what eye drops she can use OTC for drainage. Has recently been sick with nasal congestion. Recommended pt use OTC Claritin or Zyrtec and using an allergy eye gtt OTC to see if helps and if no improvement let PCP know. Pt verbalized understanding.   Reason for Disposition  [1] MODERATE back pain (e.g., interferes with normal activities) AND [2] present > 3 days  Answer Assessment - Initial Assessment Questions 1. ONSET: "When did the pain begin?"      A while  2. LOCATION: "Where does it hurt?" (upper, mid or lower back)     Lower back, L side  3. SEVERITY: "How bad is the pain?"  (e.g., Scale 1-10; mild, moderate, or severe)   - MILD (1-3): Doesn't interfere with normal activities.    - MODERATE (4-7): Interferes with normal activities or awakens from sleep.    - SEVERE (8-10): Excruciating pain, unable to do any  normal activities.      9/10 4. PATTERN: "Is the pain constant?" (e.g., yes, no; constant, intermittent)      Comes and goes, lasts for 5-15 mins.  8. MEDICINES: "What have you taken so far for the pain?" (e.g., nothing, acetaminophen, NSAIDS)     no 10. OTHER SYMPTOMS: "Do you have any other symptoms?" (e.g., fever, abdomen pain, burning with urination, blood in urine)       Abd pain  Protocols used: Back Pain-A-AH

## 2023-08-08 ENCOUNTER — Ambulatory Visit: Admitting: Family Medicine

## 2023-08-23 DIAGNOSIS — G4733 Obstructive sleep apnea (adult) (pediatric): Secondary | ICD-10-CM | POA: Diagnosis not present

## 2023-08-24 DIAGNOSIS — G4733 Obstructive sleep apnea (adult) (pediatric): Secondary | ICD-10-CM | POA: Diagnosis not present

## 2023-08-27 NOTE — Progress Notes (Unsigned)
   Established Patient Office Visit  Subjective   Patient ID: [name], female    DOB: [birth year]  Age: 43 y.o. MRN: [id number]  No chief complaint on file.   HPI  Back and stomach cramps   The ASCVD Risk score (Arnett DK, et al., 2019) failed to calculate for the following reasons:   The valid total cholesterol range is 130 to 320 mg/dL  Health Maintenance Due  Topic Date Due   Hepatitis C Screening  Never done   DTaP/Tdap/Td (1 - Tdap) Never done   Pneumococcal Vaccine 64-78 Years old (2 of 2 - PCV) 08/04/2016   Cervical Cancer Screening (HPV/Pap Cotest)  09/03/2017   INFLUENZA VACCINE  12/29/2022   COVID-19 Vaccine (3 - 2024-25 season) 01/29/2023      Objective:     LMP 07/21/2015  {Vitals History (Optional):23777}  Physical Exam   No results found for any visits on 08/28/23.      Assessment & Plan:   There are no diagnoses linked to this encounter.   No follow-ups on file.    Sandre Kitty, MD

## 2023-08-28 ENCOUNTER — Ambulatory Visit (INDEPENDENT_AMBULATORY_CARE_PROVIDER_SITE_OTHER): Admitting: Family Medicine

## 2023-08-28 ENCOUNTER — Other Ambulatory Visit (HOSPITAL_BASED_OUTPATIENT_CLINIC_OR_DEPARTMENT_OTHER): Payer: Self-pay

## 2023-08-28 ENCOUNTER — Other Ambulatory Visit (HOSPITAL_COMMUNITY): Payer: Self-pay

## 2023-08-28 ENCOUNTER — Encounter: Payer: Self-pay | Admitting: Family Medicine

## 2023-08-28 VITALS — BP 116/75 | HR 72 | Ht 66.0 in | Wt 321.0 lb

## 2023-08-28 DIAGNOSIS — G4733 Obstructive sleep apnea (adult) (pediatric): Secondary | ICD-10-CM

## 2023-08-28 DIAGNOSIS — J4540 Moderate persistent asthma, uncomplicated: Secondary | ICD-10-CM | POA: Diagnosis not present

## 2023-08-28 DIAGNOSIS — R252 Cramp and spasm: Secondary | ICD-10-CM | POA: Diagnosis not present

## 2023-08-28 DIAGNOSIS — R7303 Prediabetes: Secondary | ICD-10-CM | POA: Diagnosis not present

## 2023-08-28 MED ORDER — BUDESONIDE-FORMOTEROL FUMARATE 160-4.5 MCG/ACT IN AERO
2.0000 | INHALATION_SPRAY | Freq: Two times a day (BID) | RESPIRATORY_TRACT | 3 refills | Status: AC
  Filled 2023-08-28: qty 10.2, 30d supply, fill #0
  Filled 2023-09-29 – 2024-05-07 (×2): qty 10.2, 30d supply, fill #1

## 2023-08-28 MED ORDER — AIRSUPRA 90-80 MCG/ACT IN AERO
2.0000 | INHALATION_SPRAY | RESPIRATORY_TRACT | 5 refills | Status: DC | PRN
  Filled 2023-08-28: qty 10.7, 30d supply, fill #0
  Filled 2024-05-07: qty 10.7, 30d supply, fill #1

## 2023-08-28 NOTE — Patient Instructions (Signed)
 It was nice to see you today,  We addressed the following topics today: -For your muscle cramps I would recommend taking magnesium oxide 400 mg prior to bedtime.  This is an over-the-counter medicine.  You can take it twice a day but sometimes these medications can cause mild diarrhea so only use up to the amount that you tolerate. - I am increasing your Symbicort dose.  Still take it twice a day 2 puffs. - I am sending in a medication to replace your albuterol called Airsupra.  Uses as you would use albuterol.  If the medication is too expensive or if you would rather use the albuterol you can still stay with the albuterol. - I will follow-up with you with the results via MyChart and we can discuss it further at your next visit in 1 month.  Have a great day,  Frederic Jericho, MD

## 2023-08-28 NOTE — Assessment & Plan Note (Signed)
 Increasing Symbicort to 160 dose 2 puffs twice daily.  Sending in [place] to use instead of albuterol.

## 2023-08-28 NOTE — Assessment & Plan Note (Signed)
 Occurring in the abdominal muscles, neck and lower extremities at random intervals on a daily basis.  Generally last less than 5 minutes.  Recommended magnesium and B vitamin complex over-the-counter.  Checking magnesium and iron levels.

## 2023-08-29 LAB — COMPREHENSIVE METABOLIC PANEL WITH GFR
ALT: 15 IU/L (ref 0–32)
AST: 18 IU/L (ref 0–40)
Albumin: 3.6 g/dL — ABNORMAL LOW (ref 3.9–4.9)
Alkaline Phosphatase: 105 IU/L (ref 44–121)
BUN/Creatinine Ratio: 11 (ref 9–23)
BUN: 9 mg/dL (ref 6–24)
Bilirubin Total: 0.5 mg/dL (ref 0.0–1.2)
CO2: 21 mmol/L (ref 20–29)
Calcium: 8.7 mg/dL (ref 8.7–10.2)
Chloride: 104 mmol/L (ref 96–106)
Creatinine, Ser: 0.8 mg/dL (ref 0.57–1.00)
Globulin, Total: 3.1 g/dL (ref 1.5–4.5)
Glucose: 97 mg/dL (ref 70–99)
Potassium: 3.9 mmol/L (ref 3.5–5.2)
Sodium: 140 mmol/L (ref 134–144)
Total Protein: 6.7 g/dL (ref 6.0–8.5)
eGFR: 93 mL/min/{1.73_m2} (ref >59)

## 2023-08-29 LAB — HEMOGLOBIN A1C
Est. average glucose Bld gHb Est-mCnc: 126 mg/dL
Hgb A1c MFr Bld: 6 % — ABNORMAL HIGH (ref 4.8–5.6)

## 2023-08-29 LAB — LIPID PANEL
Chol/HDL Ratio: 2.9 ratio (ref 0.0–4.4)
Cholesterol, Total: 102 mg/dL (ref 100–199)
HDL: 35 mg/dL — ABNORMAL LOW (ref >39)
LDL Chol Calc (NIH): 47 mg/dL (ref 0–99)
Triglycerides: 104 mg/dL (ref 0–149)
VLDL Cholesterol Cal: 20 mg/dL (ref 5–40)

## 2023-08-29 LAB — CBC WITH DIFFERENTIAL/PLATELET
Basophils Absolute: 0.1 10*3/uL (ref 0.0–0.2)
Basos: 1 %
EOS (ABSOLUTE): 0.2 10*3/uL (ref 0.0–0.4)
Eos: 2 %
Hematocrit: 42.8 % (ref 34.0–46.6)
Hemoglobin: 13.5 g/dL (ref 11.1–15.9)
Immature Grans (Abs): 0 10*3/uL (ref 0.0–0.1)
Immature Granulocytes: 0 %
Lymphocytes Absolute: 2.6 10*3/uL (ref 0.7–3.1)
Lymphs: 27 %
MCH: 27.2 pg (ref 26.6–33.0)
MCHC: 31.5 g/dL (ref 31.5–35.7)
MCV: 86 fL (ref 79–97)
Monocytes Absolute: 0.9 10*3/uL (ref 0.1–0.9)
Monocytes: 10 %
Neutrophils Absolute: 5.8 10*3/uL (ref 1.4–7.0)
Neutrophils: 60 %
Platelets: 240 10*3/uL (ref 150–450)
RBC: 4.97 x10E6/uL (ref 3.77–5.28)
RDW: 12.9 % (ref 11.7–15.4)
WBC: 9.7 10*3/uL (ref 3.4–10.8)

## 2023-08-29 LAB — IRON,TIBC AND FERRITIN PANEL
Ferritin: 83 ng/mL (ref 15–150)
Iron Saturation: 14 % — ABNORMAL LOW (ref 15–55)
Iron: 45 ug/dL (ref 27–159)
Total Iron Binding Capacity: 316 ug/dL (ref 250–450)
UIBC: 271 ug/dL (ref 131–425)

## 2023-08-29 LAB — MAGNESIUM: Magnesium: 2.2 mg/dL (ref 1.6–2.3)

## 2023-08-29 LAB — VITAMIN D 25 HYDROXY (VIT D DEFICIENCY, FRACTURES): Vit D, 25-Hydroxy: 18 ng/mL — ABNORMAL LOW (ref 30.0–100.0)

## 2023-08-29 LAB — BRAIN NATRIURETIC PEPTIDE: BNP: 42.2 pg/mL (ref 0.0–100.0)

## 2023-08-31 ENCOUNTER — Encounter: Payer: Self-pay | Admitting: Family Medicine

## 2023-09-06 ENCOUNTER — Telehealth: Payer: Commercial Managed Care - PPO | Admitting: Adult Health

## 2023-09-25 ENCOUNTER — Ambulatory Visit: Admitting: Family Medicine

## 2023-09-29 ENCOUNTER — Encounter: Payer: Self-pay | Admitting: Pharmacist

## 2023-09-29 ENCOUNTER — Other Ambulatory Visit: Payer: Self-pay

## 2023-10-04 ENCOUNTER — Other Ambulatory Visit: Payer: Self-pay

## 2023-10-24 ENCOUNTER — Telehealth: Admitting: Adult Health

## 2023-11-14 ENCOUNTER — Other Ambulatory Visit (HOSPITAL_BASED_OUTPATIENT_CLINIC_OR_DEPARTMENT_OTHER): Payer: Self-pay

## 2023-11-29 ENCOUNTER — Other Ambulatory Visit (HOSPITAL_COMMUNITY): Payer: Self-pay

## 2023-12-07 ENCOUNTER — Other Ambulatory Visit (HOSPITAL_COMMUNITY): Payer: Self-pay

## 2024-04-11 ENCOUNTER — Encounter: Payer: Self-pay | Admitting: Family Medicine

## 2024-04-11 ENCOUNTER — Telehealth: Payer: Self-pay | Admitting: Pharmacist

## 2024-04-11 ENCOUNTER — Telehealth (HOSPITAL_COMMUNITY): Payer: Self-pay

## 2024-04-11 ENCOUNTER — Other Ambulatory Visit (HOSPITAL_COMMUNITY): Payer: Self-pay

## 2024-04-11 ENCOUNTER — Ambulatory Visit: Admitting: Family Medicine

## 2024-04-11 ENCOUNTER — Encounter (HOSPITAL_COMMUNITY): Payer: Self-pay

## 2024-04-11 VITALS — BP 116/71 | HR 78 | Ht 66.0 in | Wt 326.1 lb

## 2024-04-11 DIAGNOSIS — Z1212 Encounter for screening for malignant neoplasm of rectum: Secondary | ICD-10-CM

## 2024-04-11 DIAGNOSIS — Z1211 Encounter for screening for malignant neoplasm of colon: Secondary | ICD-10-CM | POA: Diagnosis not present

## 2024-04-11 DIAGNOSIS — J0110 Acute frontal sinusitis, unspecified: Secondary | ICD-10-CM | POA: Insufficient documentation

## 2024-04-11 DIAGNOSIS — G4733 Obstructive sleep apnea (adult) (pediatric): Secondary | ICD-10-CM

## 2024-04-11 MED ORDER — ZEPBOUND 2.5 MG/0.5ML ~~LOC~~ SOAJ
2.5000 mg | SUBCUTANEOUS | 1 refills | Status: DC
  Filled 2024-04-11 – 2024-04-16 (×3): qty 2, 28d supply, fill #0
  Filled 2024-05-07: qty 2, 28d supply, fill #1

## 2024-04-11 MED ORDER — AMOXICILLIN 500 MG PO CAPS
500.0000 mg | ORAL_CAPSULE | Freq: Three times a day (TID) | ORAL | 0 refills | Status: AC
Start: 2024-04-11 — End: 2024-04-18
  Filled 2024-04-11: qty 21, 7d supply, fill #0

## 2024-04-11 MED ORDER — FLUTICASONE PROPIONATE 50 MCG/ACT NA SUSP
2.0000 | Freq: Every day | NASAL | 6 refills | Status: AC
  Filled 2024-04-11: qty 16, 30d supply, fill #0
  Filled 2024-05-07: qty 16, 30d supply, fill #1

## 2024-04-11 NOTE — Assessment & Plan Note (Signed)
-   Reports right-sided nasal congestion for over two weeks, consistent with sinusitis. - Counseled on using nasal saline spray and limiting Afrin use to 2-3 days at a time to avoid rebound congestion. - Discussed that if symptoms do not improve, daily use of a nasal corticosteroid spray like Flonase is the best long-term option. - Prescribed Amoxicillin  for 7 days. - Prescribed Flonase nasal spray.

## 2024-04-11 NOTE — Assessment & Plan Note (Signed)
-   Discussed the importance of diet and exercise. Counseled on eating smaller, slower meals to avoid nausea and stomach cramps. Provided information on dietitian services ([facility] [employer] [facility], [employer]). Advised on low-impact exercise.

## 2024-04-11 NOTE — Patient Instructions (Signed)
 It was nice to see you today,  We addressed the following topics today: - I am sending a prescription for Zepbound to your pharmacy. This will likely require a prior authorization from your insurance, which my office will work on. This process can take a couple of weeks. Please let us  know if you receive any notification from the pharmacy or your insurance. - I showed you how to use the injection pen. Administer it once a week. Clean the injection site on your abdomen or thigh with an alcohol swab, press the pen firmly against the skin, and hold until you hear the second click. - The most common side effects are nausea and constipation. To help with nausea, try taking the injection at night and eat smaller, slower meals. If you experience vomiting, please contact us . We can prescribe an anti-nausea medication like Zofran  if needed. - For constipation, if you go more than 3 days without a bowel movement, start with one capful of Miralax in water daily. You can increase the dose each day you do not have a bowel movement. - I recommend incorporating diet and exercise. Start with 20-30 minutes of low-impact activity, like walking or chair yoga, five days a week. - I have sent a prescription for Amoxicillin . Take this for 7 days for your sinus symptoms. - I also sent a prescription for Flonase nasal spray. If the antibiotics do not resolve your congestion, use the Flonase daily for at least a month. It may be cheaper than the over-the-counter version with your insurance. - Please schedule a follow-up appointment in two months.  Have a great day,  Rolan Slain, MD

## 2024-04-11 NOTE — Telephone Encounter (Signed)
 Pharmacy Patient Advocate Encounter  Received notification from [employer] that Prior Authorization for Zepbound 2.5MG /0.5ML pen-injectors  has been DENIED.  Full denial letter will be uploaded to the media tab. See denial reason below.   PA #/Case ID/Reference #: [id number]     *Recent chart notes with updated weight and BMI within the last 45 days were uploaded to chart after I submitted the prior auth. I am forwarding to the appeals team.

## 2024-04-11 NOTE — Assessment & Plan Note (Signed)
-   Discussed options for sleep apnea, specifically GLP-1 agonists. Pt has been diagnosed with severe sleep apnea.  - Counseled on medication administration, common side effects including nausea and constipation, and strategies to manage them. - Plan to start Zepbound 2.5 mg subcutaneous injection once weekly for 4 weeks, then increase to 5 mg. - Follow-up in 2 months.

## 2024-04-11 NOTE — Telephone Encounter (Signed)
 Pharmacy Patient Advocate Encounter   Received notification from Pt Calls Messages that prior authorization for Zepbound 2.5MG /0.5ML pen-injectors  is required/requested.   Insurance verification completed.   The patient is insured through [employer].   Per test claim: PA required; PA submitted to above mentioned insurance via Latent Key/confirmation #/EOC [id number] Status is pending

## 2024-04-11 NOTE — Progress Notes (Signed)
 Established Patient Office Visit  Subjective   Patient ID: [name], female    DOB: Jan 20, 1981  Age: 43 y.o. MRN: [id number]  Chief Complaint  Patient presents with   Medical Management of Chronic Issues    HPI  Subjective - Discussed weight management. Interested in injectable medication for weight loss. - Nasal congestion for approximately 2 weeks or more. Right-sided nasal passage is blocked. Reports using Afrin, which causes a burning sensation.  Medications Current medications are not discussed, aside from occasional use of Afrin for nasal congestion.  PMH, PSH, FH, Social Hx PMHx: Severe sleep apnea (diagnosed 2024), previously mild sleep apnea (2021). Uses CPAP. No history of pancreatitis. PSHx: Cholecystectomy. FHx: No family history of thyroid cancer or multiple endocrine neoplasia type 2. Father tried a weight loss program ([facility] [employer]) with limited success. Social Hx: Denies experience with self-injection. Has friends who use GLP-1 agonist medications.  ROS HEENT: Reports right-sided nasal congestion and obstruction. Denies allergies to amoxicillin . GI: Denies history of pancreatitis.   The ASCVD Risk score (Arnett DK, et al., 2019) failed to calculate for the following reasons:   The valid total cholesterol range is 130 to 320 mg/dL  Health Maintenance Due  Topic Date Due   DTaP/Tdap/Td (5 - Tdap) 09/18/1989   Hepatitis C Screening  Never done   Hepatitis B Vaccines 19-59 Average Risk (1 of 3 - 19+ 3-dose series) Never done   HPV VACCINES (1 - 3-dose SCDM series) Never done   Pneumococcal Vaccine (2 of 2 - PCV) 08/04/2016   Cervical Cancer Screening (HPV/Pap Cotest)  09/03/2017   Colonoscopy  Never done   Influenza Vaccine  12/29/2023   COVID-19 Vaccine (3 - 2025-26 season) 01/29/2024      Objective:     BP 116/71   Pulse 78   Ht 5' 6 (1.676 m)   Wt (!) 326 lb 1.9 oz (147.9 kg)   LMP 07/21/2015   SpO2 96%   BMI 52.64 kg/m     Physical Exam Gen: alert, oriented Pulm: no respiratory distress Psych: pleasant affect   No results found for any visits on 04/11/24.      Assessment & Plan:   OSA (obstructive sleep apnea) Assessment & Plan: - Discussed options for sleep apnea, specifically GLP-1 agonists. Pt has been diagnosed with severe sleep apnea.  - Counseled on medication administration, common side effects including nausea and constipation, and strategies to manage them. - Plan to start Zepbound 2.5 mg subcutaneous injection once weekly for 4 weeks, then increase to 5 mg. - Follow-up in 2 months.   Encounter for colorectal cancer screening -     Ambulatory referral to Gastroenterology  Morbid obesity [facility]) Assessment & Plan: - Discussed the importance of diet and exercise. Counseled on eating smaller, slower meals to avoid nausea and stomach cramps. Provided information on dietitian services ([employer] [facility], [employer]). Advised on low-impact exercise.   Acute non-recurrent frontal sinusitis Assessment & Plan: - Reports right-sided nasal congestion for over two weeks, consistent with sinusitis. - Counseled on using nasal saline spray and limiting Afrin use to 2-3 days at a time to avoid rebound congestion. - Discussed that if symptoms do not improve, daily use of a nasal corticosteroid spray like Flonase is the best long-term option. - Prescribed Amoxicillin  for 7 days. - Prescribed Flonase nasal spray.   Other orders -     Amoxicillin ; Take 1 capsule (500 mg total) by mouth 3 (three) times  daily for 7 days.  Dispense: 21 capsule; Refill: 0 -     Zepbound; Inject 2.5 mg into the skin once a week.  Dispense: 2 mL; Refill: 1 -     Fluticasone Propionate; Place 2 sprays into both nostrils daily.  Dispense: 16 g; Refill: 6     Return in about 2 months (around 06/11/2024) for weight.    Toribio MARLA Slain, MD

## 2024-04-11 NOTE — Telephone Encounter (Signed)
 PA request has been Received. New Encounter has been or will be created for follow up. For additional info see Pharmacy Prior Auth telephone encounter from 04/11/24.

## 2024-04-11 NOTE — Telephone Encounter (Signed)
 Requested a peer to peer with patient's insurance and submitted today's clinic visit notes.   Thank you, Devere Pandy, PharmD Clinical Pharmacist  [place]  Direct Dial: [phone]

## 2024-04-15 NOTE — Telephone Encounter (Signed)
 Insurance has approved the appeal for Zepbound through 10/09/2024.  Full letter can be found under the media tab.  Thank you, Devere Pandy, PharmD Clinical Pharmacist  [place]  Direct Dial: [phone]

## 2024-04-16 ENCOUNTER — Other Ambulatory Visit (HOSPITAL_COMMUNITY): Payer: Self-pay

## 2024-05-08 ENCOUNTER — Other Ambulatory Visit (HOSPITAL_COMMUNITY): Payer: Self-pay

## 2024-05-09 ENCOUNTER — Ambulatory Visit: Admitting: Family Medicine

## 2024-05-10 ENCOUNTER — Telehealth (HOSPITAL_COMMUNITY): Payer: Self-pay

## 2024-05-10 ENCOUNTER — Other Ambulatory Visit (HOSPITAL_COMMUNITY): Payer: Self-pay

## 2024-05-13 ENCOUNTER — Other Ambulatory Visit (HOSPITAL_COMMUNITY): Payer: Self-pay

## 2024-05-13 ENCOUNTER — Telehealth (HOSPITAL_COMMUNITY): Payer: Self-pay

## 2024-05-13 NOTE — Telephone Encounter (Signed)
 PA request has been Received. New Encounter has been or will be created for follow up. For additional info see Pharmacy Prior Auth telephone encounter from 05/13/24.

## 2024-05-13 NOTE — Telephone Encounter (Signed)
 I got a prior auth request for both Airsupra  and Symbicort  but they are not typically taken together because they both have budesonide  in them. Can you please clarify which one she is supposed to be on? It looks like she got them filed on 08/31/23 but has not gotten either of them refilled since then. Please let us  know how to proceed.

## 2024-05-14 ENCOUNTER — Other Ambulatory Visit: Payer: Self-pay | Admitting: Family Medicine

## 2024-05-14 ENCOUNTER — Other Ambulatory Visit (HOSPITAL_COMMUNITY): Payer: Self-pay

## 2024-05-14 ENCOUNTER — Other Ambulatory Visit: Payer: Self-pay

## 2024-05-14 MED ORDER — ALBUTEROL SULFATE HFA 108 (90 BASE) MCG/ACT IN AERS
2.0000 | INHALATION_SPRAY | RESPIRATORY_TRACT | 11 refills | Status: AC | PRN
  Filled 2024-05-14: qty 6.7, 17d supply, fill #0
  Filled 2024-05-20: qty 6.7, 30d supply, fill #0

## 2024-05-14 NOTE — Telephone Encounter (Signed)
 Pharmacy Patient Advocate Encounter   Received notification from Pt Calls Messages that prior authorization for Airsupra  90-80MCG/ACT aerosol  is required/requested.   Insurance verification completed.   The patient is insured through [employer].   Per test claim: PA required; PA submitted to above mentioned insurance via Latent Key/confirmation #/EOC [id number] Status is pending

## 2024-05-14 NOTE — Telephone Encounter (Signed)
 She can be on both.  She is taking symbicort  as maintenance and the airsupra  as rescue.  There is no contraindication to taking airsupra  if you are taking a maintenance inhaler that contains a steroid as well.

## 2024-05-14 NOTE — Telephone Encounter (Signed)
.  Pharmacy Patient Advocate Encounter  Received notification from [employer] that Prior Authorization for Airsupra  90-80MCG/ACT aerosol  has been DENIED.  See denial reason below. No denial letter attached in CMM. Will attach denial letter to Media tab once received.   PA #/Case ID/Reference #: [id number]

## 2024-05-20 ENCOUNTER — Other Ambulatory Visit (HOSPITAL_COMMUNITY): Payer: Self-pay

## 2024-05-20 DIAGNOSIS — G4733 Obstructive sleep apnea (adult) (pediatric): Secondary | ICD-10-CM | POA: Diagnosis not present

## 2024-06-11 ENCOUNTER — Ambulatory Visit: Admitting: Family Medicine

## 2024-06-11 ENCOUNTER — Other Ambulatory Visit (HOSPITAL_BASED_OUTPATIENT_CLINIC_OR_DEPARTMENT_OTHER): Payer: Self-pay

## 2024-06-11 ENCOUNTER — Encounter: Payer: Self-pay | Admitting: Family Medicine

## 2024-06-11 DIAGNOSIS — Z713 Dietary counseling and surveillance: Secondary | ICD-10-CM | POA: Diagnosis not present

## 2024-06-11 DIAGNOSIS — Z6841 Body Mass Index (BMI) 40.0 and over, adult: Secondary | ICD-10-CM

## 2024-06-11 MED ORDER — ZEPBOUND 5 MG/0.5ML ~~LOC~~ SOAJ
5.0000 mg | SUBCUTANEOUS | 3 refills | Status: AC
  Filled 2024-06-11: qty 2, 28d supply, fill #0

## 2024-06-11 NOTE — Assessment & Plan Note (Signed)
 Zepbound  2.5 mg initially effective, effects diminished. Weight loss progressing. Discussed dose escalation and newer medication options. Emphasized medication with diet and exercise. - Increased Zepbound  to 5 mg. - Continue Zepbound  until effects diminish or weight loss plateaus. - Consider dose escalation to max 15 mg if necessary. - Recommended diet and exercise program participation. - Sent prescription to [facility].

## 2024-06-11 NOTE — Patient Instructions (Signed)
 It was nice to see you today,  We addressed the following topics today: - I have sent in an increased dose of your zepbound  to the pharmacy in [place].   - continue to use this dose until you feel it is no longer effective or you feel you are not losing weigh - combine this with a diet or nutrition plan to maximize your weight loss   Have a great day,  Rolan Slain, MD

## 2024-06-11 NOTE — Progress Notes (Unsigned)
" ° °  Established Patient Office Visit  Subjective   Patient ID: [name], female    DOB: April 09, 1981  Age: 44 y.o. MRN: [id number]  Chief Complaint  Patient presents with   Medical Management of Chronic Issues    Discussed the use of AI scribe software for clinical note transcription with the patient, who gave verbal consent to proceed.  History of Present Illness   [name] is a 44 year old female who presents for follow-up regarding her weight management and medication adjustment.  She had mild influenza confirmed last Sunday, without high fever, and managed symptoms at home without needing further care.  She takes Zepbound  for weight management. It was initially effective but has recently become less helpful. She is not using nystatin  cream or ibuprofen  except as needed. She is not taking Lexapro , which was previously prescribed for mood.          The ASCVD Risk score (Arnett DK, et al., 2019) failed to calculate for the following reasons:   The valid total cholesterol range is 130 to 320 mg/dL  Health Maintenance Due  Topic Date Due   DTaP/Tdap/Td (5 - Tdap) 09/18/1989   Hepatitis C Screening  Never done   Hepatitis B Vaccines 19-59 Average Risk (1 of 3 - 19+ 3-dose series) Never done   HPV VACCINES (1 - Risk 3-dose SCDM series) Never done   Pneumococcal Vaccine (2 of 2 - PCV) 08/04/2016   Cervical Cancer Screening (HPV/Pap Cotest)  09/03/2017   Colonoscopy  Never done   COVID-19 Vaccine (3 - 2025-26 season) 01/29/2024      Objective:     BP 139/71   Pulse 68   Ht 5' 6 (1.676 m)   Wt (!) 313 lb 3.2 oz (142.1 kg)   LMP 07/21/2015   SpO2 98%   BMI 50.55 kg/m  {Vitals History (Optional):23777}  Physical Exam     Gen: alert, oriented Pulm: no respiratory distress Psych: pleasant affect       No results found for any visits on 06/11/24.      Assessment & Plan:   Morbid obesity (HCC) Assessment & Plan: Zepbound  2.5 mg initially  effective, effects diminished. Weight loss progressing. Discussed dose escalation and newer medication options. Emphasized medication with diet and exercise. - Increased Zepbound  to 5 mg. - Continue Zepbound  until effects diminish or weight loss plateaus. - Consider dose escalation to max 15 mg if necessary. - Recommended diet and exercise program participation. - Sent prescription to [facility].   Other orders -     Zepbound ; Inject 5 mg into the skin once a week.  Dispense: 2 mL; Refill: 3         Return in about 3 months (around 09/09/2024) for physical.    Toribio MARLA Slain, MD  "

## 2024-06-21 ENCOUNTER — Other Ambulatory Visit: Payer: Self-pay | Admitting: Physician Assistant

## 2024-06-24 ENCOUNTER — Encounter (HOSPITAL_BASED_OUTPATIENT_CLINIC_OR_DEPARTMENT_OTHER): Payer: Self-pay | Admitting: Pharmacy Technician

## 2024-06-24 ENCOUNTER — Other Ambulatory Visit (HOSPITAL_BASED_OUTPATIENT_CLINIC_OR_DEPARTMENT_OTHER): Payer: Self-pay

## 2024-09-11 ENCOUNTER — Other Ambulatory Visit

## 2024-09-18 ENCOUNTER — Encounter: Admitting: Family Medicine
